# Patient Record
Sex: Female | Born: 1990 | Race: White | Hispanic: No | Marital: Married | Smoking: Never smoker
Health system: Southern US, Community
[De-identification: ages and names within clinical notes are randomized; demographics above are authoritative.]

## PROBLEM LIST (undated history)

## (undated) DIAGNOSIS — L409 Psoriasis, unspecified: Secondary | ICD-10-CM

## (undated) DIAGNOSIS — E119 Type 2 diabetes mellitus without complications: Secondary | ICD-10-CM

## (undated) DIAGNOSIS — F329 Major depressive disorder, single episode, unspecified: Secondary | ICD-10-CM

## (undated) DIAGNOSIS — F32A Depression, unspecified: Secondary | ICD-10-CM

## (undated) DIAGNOSIS — E282 Polycystic ovarian syndrome: Secondary | ICD-10-CM

## (undated) DIAGNOSIS — E7212 Methylenetetrahydrofolate reductase deficiency: Secondary | ICD-10-CM

## (undated) DIAGNOSIS — N390 Urinary tract infection, site not specified: Secondary | ICD-10-CM

## (undated) DIAGNOSIS — I809 Phlebitis and thrombophlebitis of unspecified site: Secondary | ICD-10-CM

## (undated) HISTORY — DX: Phlebitis and thrombophlebitis of unspecified site: I80.9

## (undated) HISTORY — DX: Urinary tract infection, site not specified: N39.0

## (undated) HISTORY — DX: Type 2 diabetes mellitus without complications: E11.9

## (undated) HISTORY — DX: Polycystic ovarian syndrome: E28.2

## (undated) HISTORY — DX: Major depressive disorder, single episode, unspecified: F32.9

## (undated) HISTORY — DX: Depression, unspecified: F32.A

---
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
## 2011-05-27 HISTORY — PX: CHOLECYSTECTOMY: SHX55

## 2012-05-26 DIAGNOSIS — Z1589 Genetic susceptibility to other disease: Secondary | ICD-10-CM

## 2012-05-26 HISTORY — PX: TUMOR REMOVAL: SHX12

## 2012-05-26 HISTORY — DX: Genetic susceptibility to other disease: Z15.89

## 2013-08-14 LAB — CBC AND DIFFERENTIAL
HCT: 45 (ref 36–46)
HEMOGLOBIN: 14.8 (ref 12.0–16.0)
NEUTROS ABS: 3
PLATELETS: 187 (ref 150–399)
WBC: 6.4

## 2013-08-15 LAB — VITAMIN D 25 HYDROXY (VIT D DEFICIENCY, FRACTURES): Vit D, 25-Hydroxy: 11.6

## 2015-10-25 LAB — HM PAP SMEAR

## 2016-06-12 LAB — HEPATIC FUNCTION PANEL
ALK PHOS: 53 (ref 25–125)
ALT: 35 (ref 7–35)
AST: 60 — AB (ref 13–35)
BILIRUBIN, TOTAL: 2.8

## 2016-06-12 LAB — BASIC METABOLIC PANEL
BUN: 9 (ref 4–21)
CREATININE: 0.5 (ref 0.5–1.1)
Glucose: 138
Potassium: 4.6 (ref 3.4–5.3)
Sodium: 140 (ref 137–147)

## 2016-06-12 LAB — CBC AND DIFFERENTIAL
HEMATOCRIT: 46 (ref 36–46)
Hemoglobin: 14.7 (ref 12.0–16.0)
NEUTROS ABS: 4
PLATELETS: 208 (ref 150–399)
WBC: 8.9

## 2016-06-12 LAB — LIPID PANEL
CHOLESTEROL: 123 (ref 0–200)
HDL: 35 (ref 35–70)
LDL Cholesterol: 54
TRIGLYCERIDES: 172 — AB (ref 40–160)

## 2017-04-28 ENCOUNTER — Ambulatory Visit (INDEPENDENT_AMBULATORY_CARE_PROVIDER_SITE_OTHER): Payer: BLUE CROSS/BLUE SHIELD | Admitting: Family Medicine

## 2017-04-28 ENCOUNTER — Telehealth: Payer: Self-pay

## 2017-04-28 ENCOUNTER — Telehealth: Payer: Self-pay | Admitting: Family Medicine

## 2017-04-28 ENCOUNTER — Encounter: Payer: Self-pay | Admitting: Family Medicine

## 2017-04-28 VITALS — BP 151/88 | HR 84 | Ht 64.5 in | Wt 298.4 lb

## 2017-04-28 DIAGNOSIS — E1165 Type 2 diabetes mellitus with hyperglycemia: Secondary | ICD-10-CM | POA: Diagnosis not present

## 2017-04-28 DIAGNOSIS — F419 Anxiety disorder, unspecified: Secondary | ICD-10-CM | POA: Insufficient documentation

## 2017-04-28 DIAGNOSIS — L0291 Cutaneous abscess, unspecified: Secondary | ICD-10-CM | POA: Diagnosis not present

## 2017-04-28 DIAGNOSIS — R030 Elevated blood-pressure reading, without diagnosis of hypertension: Secondary | ICD-10-CM | POA: Insufficient documentation

## 2017-04-28 DIAGNOSIS — D689 Coagulation defect, unspecified: Secondary | ICD-10-CM | POA: Diagnosis not present

## 2017-04-28 DIAGNOSIS — Z23 Encounter for immunization: Secondary | ICD-10-CM

## 2017-04-28 DIAGNOSIS — F321 Major depressive disorder, single episode, moderate: Secondary | ICD-10-CM | POA: Diagnosis not present

## 2017-04-28 DIAGNOSIS — L409 Psoriasis, unspecified: Secondary | ICD-10-CM | POA: Diagnosis not present

## 2017-04-28 LAB — POCT GLYCOSYLATED HEMOGLOBIN (HGB A1C): HEMOGLOBIN A1C: 9.4

## 2017-04-28 MED ORDER — CITALOPRAM HYDROBROMIDE 20 MG PO TABS: 40.0000 mg | ORAL_TABLET | Freq: Every day | ORAL | 3 refills | Status: DC

## 2017-04-28 MED ORDER — DULAGLUTIDE 1.5 MG/0.5ML ~~LOC~~ SOAJ: SUBCUTANEOUS | 5 refills | Status: AC

## 2017-04-28 MED ORDER — DOXYCYCLINE HYCLATE 100 MG PO TABS: 100.0000 mg | ORAL_TABLET | Freq: Two times a day (BID) | ORAL | 0 refills | Status: DC

## 2017-04-28 MED ORDER — CLOBETASOL PROPIONATE 0.05 % EX CREA: 1.0000 "application " | TOPICAL_CREAM | Freq: Two times a day (BID) | CUTANEOUS | 0 refills | Status: DC

## 2017-04-28 NOTE — Assessment & Plan Note (Signed)
A1c 9.4 today.  Continue metformin 1000 mg twice daily.  Start Trulicity.  Stop Humalog.  Follow-up in 1 month.  Consider addition of sulfonylurea or basal insulin if A1c not at goal in 3 months.  Due for preventative diabetes care-was not discussed at this meeting due to time constraints.

## 2017-04-28 NOTE — Assessment & Plan Note (Signed)
No signs of clot today.  Will avoid OCP in the future.

## 2017-04-28 NOTE — Telephone Encounter (Signed)
Do not see that Metformin was called in  OV:04/28/17

## 2017-04-28 NOTE — Assessment & Plan Note (Signed)
PHQ 9 score 12 today.  Start Celexa 20 mg daily.  Increase to 40 mg daily in 1-2 weeks if doing well without side effects.  Follow-up with me in 4 weeks.  Consider alternative SSRI if not adequately controlled with Celexa.

## 2017-04-28 NOTE — Patient Instructions (Addendum)
Start the [employer]m [employer] Haasrulicity.  Stop Humalog.  Start clobetasol cream for your psoriasis.  Start Celexa 20 mg daily for 1-2 weeks.  Increase to 40 mg daily if you do well without side effects.  I will send in a week course of doxycycline.  Please let me know if the area on her skin worsens or does not improve with antibiotics.  Come back to see me in about 4 weeks for follow-up.  Take care Dr. Jimmey RalphParker

## 2017-04-28 NOTE — Telephone Encounter (Unsigned)
Copied from CRM [id number]. >> Apr 28, 2017  2:04 PM Raquel SarnaHayes, Teresa G wrote: Pt is still waiting for Metformin 500mg  extended release to be called in for her.  It wasn't included in the Rx's from earlier today she pick up at the pharmacy.  [facility] [address]

## 2017-04-28 NOTE — Telephone Encounter (Signed)
PA for clobetasol propionate approved through 04/27/2018.  Patient's pharmacy has been notified.

## 2017-04-28 NOTE — Assessment & Plan Note (Signed)
GAD 7 score 15 today.  Start Celexa as noted above.  Follow-up in 4 weeks.

## 2017-04-28 NOTE — Assessment & Plan Note (Signed)
Initially elevated 157/94 today.  Per patient she is typically in the 120s over 80s at home.  No indication to start medications today.  We will follow-up at next office visit.  Will consider ACE inhibitor given her concurrent type 2 diabetes if continues to be elevated.

## 2017-04-28 NOTE — Progress Notes (Signed)
Subjective:  Bonnie Glenn is a 26 y.o. female who presents today with a chief complaint of T2DM and to establish care.   HPI:  [place] with her husband.  T2DM, New Problem Several year history.  Current medications include metformin 1000 mg twice daily and Humalog 10 units with meals 3 times daily.  Fasting sugars per patient usually in the 180s.  Last her A1c checked 10 months ago and it was 9.6.  Has been on several medications in the past including Lantus, Jardiance, and glipizide.  No polyuria polydipsia.  Was unable to tolerate jardiance due to recurrent UTI.   Psoriasis, new problem Several year history.  Located on her elbows and hands.  Uses clobetasol cream which helps.  Depression/Anxiety, new problem Several year history.  Patient has been on several medications in the past.  Most recently has been on citalopram which worked well for her.  She has both depression and anxiety symptoms.  See below PHQ/GAD.  No HI or SI.  She is currently seeing a counselor which is helping.   Depression screen PHQ 2/9 04/28/2017  Decreased Interest 1  Down, Depressed, Hopeless 1  PHQ - 2 Score 2  Altered sleeping 3  Tired, decreased energy 2  Change in appetite 2  Feeling bad or failure about yourself  1  Trouble concentrating 2  Moving slowly or fidgety/restless 0  Suicidal thoughts 0  PHQ-9 Score 12    GAD 7 : Generalized Anxiety Score 04/28/2017  Nervous, Anxious, on Edge 3  Control/stop worrying 3  Worry too much - different things 3  Trouble relaxing 2  Restless 1  Easily annoyed or irritable 3  Afraid - awful might happen 0  Total GAD 7 Score 15    ROS: No SI or HI.  Rash, acute issue Started a few days ago.  Patient has a history of recurrent rashes along her abdomen.  Will usually last for a few days and then resolved.  Thinks that she may have been bitten by something.  No fevers or chills.  No drainage.  ROS: Per HPI,  otherwise a 14 point review of systems was performed and was negative  PMH:  The following were reviewed and entered/updated in epic: Past Medical History:  Diagnosis Date  . Depression   . Diabetes mellitus without complication (HCC)   . PCOS (polycystic ovarian syndrome)   . Phlebitis   . Urinary tract infection    Patient Active Problem List   Diagnosis Date Noted  . Blood clotting disorder (HCC) 04/28/2017  . Type 2 diabetes mellitus with hyperglycemia (HCC) 04/28/2017  . Psoriasis 04/28/2017  . Depression, major, single episode, moderate (HCC) 04/28/2017  . Anxiety 04/28/2017  . Morbid obesity (HCC) 04/28/2017  . Elevated blood pressure reading 04/28/2017   Past Surgical History:  Procedure Laterality Date  . CHOLECYSTECTOMY  2013  . EYE SURGERY Left 1998   Blood Clot Removed Due To Injury  . TUMOR REMOVAL  2014   Left Ovary    Family History  Problem Relation Age of Onset  . COPD Mother   . Hypertension Mother   . Alcohol abuse Father   . Early death Father   . Heart attack Father   . Alcohol abuse Sister   . Drug abuse Sister   . COPD Maternal Grandmother   . Mental illness Maternal Grandmother   . Birth defects Maternal Grandfather   . Early death Paternal Grandmother  Medications- reviewed and updated Current Outpatient Medications  Medication Sig Dispense Refill  . metFORMIN (GLUCOPHAGE-XR) 500 MG 24 hr tablet Take 500 mg by mouth. Take 2 tablets 2 times daily    . citalopram (CELEXA) 20 MG tablet Take 2 tablets (40 mg total) by mouth daily. 60 tablet 3  . clobetasol cream (TEMOVATE) 0.05 % Apply 1 application topically 2 (two) times daily. 30 g 0  . doxycycline (VIBRA-TABS) 100 MG tablet Take 1 tablet (100 mg total) by mouth 2 (two) times daily. 14 tablet 0  . Dulaglutide 1.5 MG/0.5ML SOPN Inject into the skin weekly. 4 pen 5   No current facility-administered medications for this visit.    Allergies-reviewed and updated No Known  Allergies  Social History   Socioeconomic History  . Marital status: Married    Spouse name: None  . Number of children: 0  . Years of education: None  . Highest education level: None  Social Needs  . Financial resource strain: None  . Food insecurity - worry: None  . Food insecurity - inability: None  . Transportation needs - medical: None  . Transportation needs - non-medical: None  Occupational History  . Occupation: Archivist  Tobacco Use  . Smoking status: Never Smoker  . Smokeless tobacco: Never Used  Substance and Sexual Activity  . Alcohol use: No    Frequency: Never  . Drug use: No  . Sexual activity: No    Partners: Male  Other Topics Concern  . None  Social History Narrative  . None   Objective:  Physical Exam: BP (!) 151/88 (BP Location: Left Arm, Cuff Size: Normal)   Pulse 84   Ht 5' 4.5" (1.638 m)   Wt 298 lb 6.4 oz (135.4 kg)   LMP 04/09/2017 (Exact Date)   SpO2 98%   BMI 50.43 kg/m   Gen: NAD, resting comfortably CV: RRR with no murmurs appreciated Pulm: NWOB, CTAB with no crackles, wheezes, or rhonchi GI: Morbidly obese, normal bowel sounds present. Soft, Nontender, Nondistended. MSK: No edema, cyanosis, or clubbing noted Skin: 1 cm abscess on the left lower abdomen with surrounding erythema.  Raised, erythematous, scaly plaques on elbows bilaterally as well as on hands. Neuro: Grossly normal, moves all extremities Psych: Normal affect and thought content  Results for orders placed or performed in visit on 04/28/17 (from the past 72 hour(s))  POCT HgB A1C     Status: None   Collection Time: 04/28/17  9:59 AM  Result Value Ref Range   Hemoglobin A1C 9.4    Assessment/Plan:  Type 2 diabetes mellitus with hyperglycemia (HCC) A1c 9.4 today.  Continue metformin 1000 mg twice daily.  Start Trulicity.  Stop Humalog.  Follow-up in 1 month.  Consider addition of sulfonylurea or basal insulin if A1c not at goal in 3 months.  Due for preventative  diabetes care-was not discussed at this meeting due to time constraints.  Psoriasis Clobetasol cream sent in today.  Consider referral to dermatology if not adequately controlled with topical steroid.  Depression, major, single episode, moderate (HCC) PHQ 9 score 12 today.  Start Celexa 20 mg daily.  Increase to 40 mg daily in 1-2 weeks if doing well without side effects.  Follow-up with me in 4 weeks.  Consider alternative SSRI if not adequately controlled with Celexa.  Anxiety GAD 7 score 15 today.  Start Celexa as noted above.  Follow-up in 4 weeks.  Morbid obesity (HCC) BMI 50.43 today.  Discussed lifestyle modifications.  Hopefully will have some weight loss with GLP-1 agonist.  Blood clotting disorder (HCC) No signs of clot today.  Will avoid OCP in the future.  Elevated blood pressure reading Initially elevated 157/94 today.  Per patient she is typically in the 120s over 80s at home.  No indication to start medications today.  We will follow-up at next office visit.  Will consider ACE inhibitor given her concurrent type 2 diabetes if continues to be elevated.  Abscess No signs of systemic infection.  Offered I&D today, however patient deferred.  Start doxycycline for 7-day course.  Strict return precautions reviewed.  Preventative healthcare Flu shot given today.  Will obtain prior records for her most recent Pap and screening blood work.  Katina Degreealeb M. Jimmey RalphParker, MD 04/28/2017 10:10 AM

## 2017-04-28 NOTE — Telephone Encounter (Signed)
Copied from CRM [id number]. Topic: Inquiry >> Apr 28, 2017  2:04 PM Raquel SarnaHayes, Teresa G wrote: Pt is still waiting for Metformin 500mg  extended release to be called in for her.  It wasn't included in the Rx's from earlier today she pick up at the pharmacy.  [facility] [address]

## 2017-04-28 NOTE — Assessment & Plan Note (Signed)
Clobetasol cream sent in today.  Consider referral to dermatology if not adequately controlled with topical steroid.

## 2017-04-28 NOTE — Assessment & Plan Note (Signed)
BMI 50.43 today.  Discussed lifestyle modifications.  Hopefully will have some weight loss with GLP-1 agonist.

## 2017-04-29 ENCOUNTER — Other Ambulatory Visit: Payer: Self-pay

## 2017-04-29 MED ORDER — METFORMIN HCL ER 500 MG PO TB24: 1000.0000 mg | ORAL_TABLET | Freq: Two times a day (BID) | ORAL | 5 refills | Status: DC

## 2017-04-29 MED ORDER — METFORMIN HCL ER 500 MG PO TB24: 500.0000 mg | ORAL_TABLET | Freq: Every day | ORAL | 5 refills | Status: DC

## 2017-04-29 NOTE — Telephone Encounter (Signed)
Metformin has been sent to pharmacy.

## 2017-05-12 ENCOUNTER — Encounter: Payer: Self-pay | Admitting: Physical Therapy

## 2017-05-21 ENCOUNTER — Encounter: Payer: Self-pay | Admitting: Physical Therapy

## 2017-05-27 ENCOUNTER — Ambulatory Visit: Payer: BLUE CROSS/BLUE SHIELD | Admitting: Family Medicine

## 2017-06-10 ENCOUNTER — Encounter: Payer: Self-pay | Admitting: Family Medicine

## 2017-06-10 ENCOUNTER — Ambulatory Visit (INDEPENDENT_AMBULATORY_CARE_PROVIDER_SITE_OTHER): Payer: BLUE CROSS/BLUE SHIELD | Admitting: Family Medicine

## 2017-06-10 VITALS — BP 134/82 | HR 100 | Temp 98.9°F | Ht 64.5 in | Wt 302.0 lb

## 2017-06-10 DIAGNOSIS — F419 Anxiety disorder, unspecified: Secondary | ICD-10-CM

## 2017-06-10 DIAGNOSIS — F321 Major depressive disorder, single episode, moderate: Secondary | ICD-10-CM

## 2017-06-10 DIAGNOSIS — E1165 Type 2 diabetes mellitus with hyperglycemia: Secondary | ICD-10-CM | POA: Diagnosis not present

## 2017-06-10 DIAGNOSIS — L409 Psoriasis, unspecified: Secondary | ICD-10-CM | POA: Diagnosis not present

## 2017-06-10 LAB — MICROALBUMIN / CREATININE URINE RATIO
Creatinine,U: 124.4 mg/dL
Microalb Creat Ratio: 64.7 mg/g — ABNORMAL HIGH (ref 0.0–30.0)
Microalb, Ur: 80.5 mg/dL — ABNORMAL HIGH (ref 0.0–1.9)

## 2017-06-10 MED ORDER — TRIAMCINOLONE ACETONIDE 0.5 % EX OINT: 1.0000 "application " | TOPICAL_OINTMENT | Freq: Two times a day (BID) | CUTANEOUS | 0 refills | Status: DC

## 2017-06-10 MED ORDER — CITALOPRAM HYDROBROMIDE 20 MG PO TABS: 20.0000 mg | ORAL_TABLET | Freq: Every day | ORAL | 11 refills | Status: AC

## 2017-06-10 NOTE — Assessment & Plan Note (Signed)
Discussed lifestyle interventions.  Referral to nutrition placed.  Referral to bariatric surgery placed.

## 2017-06-10 NOTE — Assessment & Plan Note (Signed)
Unable to afford clobetasol.  We will try triamcinolone 0.5% cream.  May ultimately need referral to dermatology with consideration of biological agents if symptoms not controlled with topical steroid.

## 2017-06-10 NOTE — Assessment & Plan Note (Signed)
Doing well on current regimen of Trulicity and metformin.  We will continue both of these.  Patient will let us know if the headaches persist.  If this happens, we can decrease to the 0.75 mg weekly injection.  Will check urine microalbumin/creatinine ratio today.  Advised patient to have eye exam done soon.  She will need her foot exam soon.  Offered pneumonia shot however patient deferred.  Follow-up in 2 months for repeat A1c.

## 2017-06-10 NOTE — Progress Notes (Signed)
Subjective:  Bonnie Glenn is a 27 y.o. female who presents today with a chief complaint of abscess follow-up.   HPI:  Abscess, established problem, improving Patient seen about a month ago for this.  She was started on a week's course of doxycycline and did well.  Symptoms have since resolved.  Depression/Anxiety, established problem, Stable Current Medications: Celexa 20 mg daily Side Effects: None Current Symptoms/Interim History: Patient initially seen about a month ago for both of these problems.  She was started on Celexa at that time.  Initially was on Celexa 20 mg daily.  She try to increase to 40 mg daily however noticed increased fidgetiness and anxiety.  She feels like the 20 mg dose works better for her.  She has noticed a significant improvement in her symptoms.  Depression screen PHQ 2/9 06/10/2017  Decreased Interest 1  Down, Depressed, Hopeless 1  PHQ - 2 Score 2  Altered sleeping 1  Tired, decreased energy 1  Change in appetite 1  Feeling bad or failure about yourself  1  Trouble concentrating 0  Moving slowly or fidgety/restless 0  Suicidal thoughts 0  PHQ-9 Score 6  Difficult doing work/chores Somewhat difficult    GAD 7 : Generalized Anxiety Score 06/10/2017  Nervous, Anxious, on Edge 1  Control/stop worrying 1  Worry too much - different things 1  Trouble relaxing 1  Restless 0  Easily annoyed or irritable 0  Afraid - awful might happen 1  Total GAD 7 Score 5  Anxiety Difficulty Somewhat difficult    ROS: No SI or HI.  DIABETES Type II, established problem, Stable Patient is currently on metformin 1000 mg twice daily and Trulicity 1.5 mg weekly.  She started Trulicity about a month ago.  She has done well with this.  She noticed that she has an occasional headache the day after taking the Trulicity however does not notice any other side effects.  No nausea or vomiting.  No constipation or diarrhea.  No polyuria or polydipsia.  No hypoglycemic symptoms-no  palpitations, tremors, or anxiousness.  Psoriasis, established problem, stable Seen for this about a month ago.  She was prescribed clobetasol however she is not able to afford it.  She has not tried anything in the interim.  Lesions are stable since her last visit.  Morbid obesity, established problem, stable Several year history.  She is currently doing a weight watchers program which does not significantly seem to help.  She has been on medications including phentermine in the past which have not worked well for her either.  She has some interest in seeing a surgeon to discuss bariatric surgery.  She goes to the gym 2-3 times weekly and exercises for about 45 minutes each time.  ROS: Per HPI  PMH: She reports that  has never smoked. she has never used smokeless tobacco. She reports that she does not drink alcohol or use drugs.  Objective:  Physical Exam: BP 134/82 (BP Location: Left Arm, Patient Position: Sitting, Cuff Size: Large)   Pulse 100   Temp 98.9 F (37.2 C) (Oral)   Ht 5' 4.5" (1.638 m)   Wt (!) 302 lb (137 kg)   SpO2 98%   BMI 51.04 kg/m   Gen: NAD, resting comfortably CV: RRR with no murmurs appreciated Pulm: NWOB, CTAB with no crackles, wheezes, or rhonchi GI: Morbidly obese, normal bowel sounds present. Soft, Nontender, Nondistended. MSK: No edema, cyanosis, or clubbing noted Skin: Warm, dry Neuro: Grossly normal, moves all  extremities Psych: Normal affect and thought content  Assessment/Plan:  Depression, major, single episode, moderate (HCC) PHQ 9 significantly improved to 6 today.  Continue Celexa 20 mg daily.  Follow-up in 2 months.  Anxiety See depression A/P.  Continue Celexa 20 mg daily.  Type 2 diabetes mellitus with hyperglycemia (HCC) Doing well on current regimen of Trulicity and metformin.  We will continue both of these.  Patient will let us know if the headaches persist.  If this happens, we can decrease to the 0.75 mg weekly injection.  Will  check urine microalbumin/creatinine ratio today.  Advised patient to have eye exam done soon.  She will need her foot exam soon.  Offered pneumonia shot however patient deferred.  Follow-up in 2 months for repeat A1c.  Psoriasis Unable to afford clobetasol.  We will try triamcinolone 0.5% cream.  May ultimately need referral to dermatology with consideration of biological agents if symptoms not controlled with topical steroid.   Morbid obesity (HCC) Discussed lifestyle interventions.  Referral to nutrition placed.  Referral to bariatric surgery placed.  Abscess Resolved with antibiotics.  No further intervention needed.  Katina Degreealeb M. Jimmey RalphParker, MD 06/10/2017 3:00 PM

## 2017-06-10 NOTE — Assessment & Plan Note (Signed)
PHQ 9 significantly improved to 6 today.  Continue Celexa 20 mg daily.  Follow-up in 2 months.

## 2017-06-10 NOTE — Patient Instructions (Signed)
Continue Celexa 20 mg daily.  I am glad this is working well for you.  Let me know if you continue to have headaches with the Trulicity.  We can decrease the dose if this is the case.  Please use the triamcinolone for your psoriasis.  I will put in a referral to nutritionist into the bariatric surgery.  Please come back to see me in 2 months, or sooner as needed.  Take care, Dr. Jimmey RalphParker

## 2017-06-10 NOTE — Assessment & Plan Note (Signed)
See depression A/P.  Continue Celexa 20 mg daily.

## 2017-06-11 ENCOUNTER — Encounter: Payer: Self-pay | Admitting: Family Medicine

## 2017-06-11 DIAGNOSIS — R809 Proteinuria, unspecified: Secondary | ICD-10-CM

## 2017-06-11 DIAGNOSIS — E1129 Type 2 diabetes mellitus with other diabetic kidney complication: Secondary | ICD-10-CM | POA: Insufficient documentation

## 2017-06-11 NOTE — Progress Notes (Signed)
Patient had a small amount of protein in her urine - do not need to make any changes right now. We should re-check again at her follow up appointment in 2 months.  Bonnie Degreealeb M. Jimmey RalphParker, MD 06/11/2017 8:18 AM

## 2017-06-23 ENCOUNTER — Encounter: Payer: Self-pay | Admitting: Physical Therapy

## 2017-06-23 ENCOUNTER — Ambulatory Visit: Payer: BLUE CROSS/BLUE SHIELD | Admitting: Registered"

## 2017-06-26 ENCOUNTER — Encounter: Payer: Self-pay | Admitting: Family Medicine

## 2017-07-01 ENCOUNTER — Encounter: Payer: Self-pay | Admitting: Registered"

## 2017-07-01 ENCOUNTER — Encounter: Payer: BLUE CROSS/BLUE SHIELD | Attending: Family Medicine | Admitting: Registered"

## 2017-07-01 DIAGNOSIS — Z713 Dietary counseling and surveillance: Secondary | ICD-10-CM | POA: Diagnosis present

## 2017-07-01 DIAGNOSIS — E119 Type 2 diabetes mellitus without complications: Secondary | ICD-10-CM

## 2017-07-01 NOTE — Patient Instructions (Addendum)
-   Aim to eat well-balanced meals.  - Increase non-starchy vegetable intake.   - Pair carbohydrates with protein sources.   - Add in snacks as needed.

## 2017-07-01 NOTE — Progress Notes (Signed)
  Medical Nutrition Therapy:  Appt start time: 2:30 end time:  2:51.  Assessment:  Primary concerns today: Pt states she has officially decided to have bariatric surgery, upcoming initial consult with surgeon on Friday. Pt states she feels she needs to do something with health considering her uncle and dad both died at an early age. Pt states her uncle had diabetes. Pt states she was diagnosed with diabetes at age 27. Pt reports recent A1c was 9.6; states it is an improvement from previous results. Pt states she her BS: FBS (115) and 2 hours after meals (140). Pt states she meal plans for the week on Sundays; sometimes she and husband eat out a lot. Pt states she is currently in school studying information technology.  Preferred Learning Style:   No preference indicated   Learning Readiness:   Ready  Change in progress   MEDICATIONS: See list   DIETARY INTAKE:  Usual eating pattern includes 3 meals and 1-2 snacks per day.  Everyday foods include eggs, toast, fruit, sandwiches, and chips.  Avoided foods include none.    24-hr recall:  B ( AM): 2 scrambled eggs, 2 toast, lite spread  Snk ( AM): sometimes dried fruit, chex mix L ( PM): chicken wings (air-fried), sweet potato fries (air-fried) Snk ( PM): sometimes banana D ( PM): ham and cheese sandwich, baked chips Snk ( PM): none Beverages: coffee with protein shake, water, sparkling ice, unsweetened tea   Usual physical activity: sometimes gym 40 min, 3x/week  Estimated energy needs: 2000 calories 225 g carbohydrates 150 g protein 56 g fat  Progress Towards Goal(s):  In progress.   Nutritional Diagnosis:  NI-5.8.5 Inadeqate fiber intake As related to food- and nutrition-related knowledge deficit concerning desirable quantities of fiber.  As evidenced by pt report of insufficient fiber intake when compared to recommended amounts (25g/day for women) .    Intervention:  Nutrition education and counseling. Pt was educated and  counseled on MyPlate method, eating well-balanced meals, and the benefits of increasing fiber intake.  Goals: - Aim to eat well-balanced meals. - Increase non-starchy vegetable intake.  - Pair carbohydrates with protein sources.  - Add in snacks as needed.   Teaching Method Utilized:  Visual Auditory Hands on  Handouts given during visit include:  MyPlate  Barriers to learning/adherence to lifestyle change: none  Demonstrated degree of understanding via:  Teach Back   Monitoring/Evaluation:  Dietary intake, exercise, and body weight prn.

## 2017-07-10 ENCOUNTER — Other Ambulatory Visit (HOSPITAL_COMMUNITY): Payer: Self-pay | Admitting: Surgery

## 2017-07-13 ENCOUNTER — Encounter: Payer: Self-pay | Admitting: Family Medicine

## 2017-07-15 ENCOUNTER — Encounter: Payer: BLUE CROSS/BLUE SHIELD | Admitting: Registered"

## 2017-07-15 DIAGNOSIS — E119 Type 2 diabetes mellitus without complications: Secondary | ICD-10-CM

## 2017-07-15 DIAGNOSIS — Z713 Dietary counseling and surveillance: Secondary | ICD-10-CM | POA: Diagnosis not present

## 2017-07-15 NOTE — Progress Notes (Signed)
Pre-Op Assessment Visit:  Pre-Operative Sleeve/RYGB Surgery  Medical Nutrition Therapy:  Appt start time: 9:30  End time: 10:10  Patient was seen on 07/15/2017 for Pre-Operative Nutrition Assessment. Assessment [facility] on 07/15/2017.   Pt expectation of surgery: lose 75-100 lbs in 1 year; goal of 145 lbs in 2 years, improve quality of life, better managed diabetes  Pt expectation of Dietitian: guidance   Start weight at [facility]: 301.7 BMI: 51.38    Pt arrives with husband and states she tries to avoid pasta, rice and substitute with cauliflower rice, zucchini noodles. Pt states she likes 3 flavors of Premier protein. Pt's recent A1c was 9.4. Pt checks BS: FBS (115) and 2 hrs after meals (140).   Per insurance, pt needs 0 SWL visits prior to surgery.    24 hr Dietary Recall: First Meal: scrambled eggs, toast Snack: none Second Meal: leftovers or [employer]-sub sandwich Snack: none Third Meal: chicken wings (air fried), mashed cauliflower Snack: sometimes ice cream sandwich or fruit or vegetables with peanut butter/ranch or yogurt or sugar free pudding Beverages: water, unsweetened tea, vitamin water zero, 1c coffee (every other day)  Encouraged to engage in 150 minutes of moderate physical activity including cardiovascular and weight baring weekly  Handouts given during visit include:  . Pre-Op Goals . Bariatric Surgery Protein Shakes . Vitamin and Mineral Recommendations  During the appointment today the following Pre-Op Goals were reviewed with the patient: . Track your food and beverage: MyFitness Pal or Baritastic App . Make healthy food choices . Begin to limit portion sizes . Limited concentrated sugars and fried foods . Keep fat/sugar in the single digits per serving on          food labels . Practice CHEWING your food  (aim for 30 chews per bite or until applesauce  consistency) . Practice not drinking 15 minutes before, during, and 30 minutes after each meal/snack . Avoid all carbonated beverages  . Avoid/limit caffeinated beverages  . Avoid all sugar-sweetened beverages . Avoid alcohol . Consume 3 meals per day; eat every 3-5 hours . Make a list of non-food related activities . Aim for 64-100 ounces of FLUID daily  . Aim for at least 60-80 grams of PROTEIN daily . Look for a liquid protein source that contain ?15 g protein and ?5 g carbohydrate  (ex: shakes, drinks, shots) . Physical activity is an important part of a healthy lifestyle so keep it moving!  Follow diet recommendations listed below Energy and Macronutrient Recommendations: Calories: 2000 Carbohydrate: 225 Protein: 150 Fat: 56  Demonstrated degree of understanding via:  Teach Back   Teaching Method Utilized:  Visual Auditory Hands on  Barriers to learning/adherence to lifestyle change: none identified  Patient to call the [facility] to enroll in Pre-Op and Post-Op Nutrition Education when surgery date is scheduled.

## 2017-07-17 ENCOUNTER — Ambulatory Visit (HOSPITAL_COMMUNITY)
Admission: RE | Admit: 2017-07-17 | Discharge: 2017-07-17 | Disposition: A | Discharge status: Home / Self Care | Payer: BLUE CROSS/BLUE SHIELD | Source: Ambulatory Visit | Attending: Surgery | Admitting: Surgery

## 2017-07-17 DIAGNOSIS — K449 Diaphragmatic hernia without obstruction or gangrene: Secondary | ICD-10-CM | POA: Diagnosis not present

## 2017-07-27 ENCOUNTER — Encounter: Payer: BLUE CROSS/BLUE SHIELD | Attending: Surgery | Admitting: Skilled Nursing Facility1

## 2017-07-27 DIAGNOSIS — Z713 Dietary counseling and surveillance: Secondary | ICD-10-CM | POA: Diagnosis present

## 2017-07-27 DIAGNOSIS — E119 Type 2 diabetes mellitus without complications: Secondary | ICD-10-CM

## 2017-07-29 ENCOUNTER — Encounter: Payer: Self-pay | Admitting: Family Medicine

## 2017-07-29 ENCOUNTER — Ambulatory Visit (INDEPENDENT_AMBULATORY_CARE_PROVIDER_SITE_OTHER): Payer: BLUE CROSS/BLUE SHIELD | Admitting: Family Medicine

## 2017-07-29 ENCOUNTER — Encounter: Payer: Self-pay | Admitting: Skilled Nursing Facility1

## 2017-07-29 VITALS — BP 128/70 | HR 68 | Temp 97.8°F | Ht 64.5 in | Wt 304.0 lb

## 2017-07-29 DIAGNOSIS — R809 Proteinuria, unspecified: Secondary | ICD-10-CM | POA: Diagnosis not present

## 2017-07-29 DIAGNOSIS — L409 Psoriasis, unspecified: Secondary | ICD-10-CM

## 2017-07-29 DIAGNOSIS — E1129 Type 2 diabetes mellitus with other diabetic kidney complication: Secondary | ICD-10-CM

## 2017-07-29 DIAGNOSIS — E1165 Type 2 diabetes mellitus with hyperglycemia: Secondary | ICD-10-CM | POA: Diagnosis not present

## 2017-07-29 LAB — POCT GLYCOSYLATED HEMOGLOBIN (HGB A1C): HEMOGLOBIN A1C: 7.5

## 2017-07-29 MED ORDER — GLUCOSE BLOOD VI STRP: ORAL_STRIP | 12 refills | Status: DC

## 2017-07-29 MED ORDER — CLOBETASOL PROPIONATE 0.05 % EX OINT: 1.0000 "application " | TOPICAL_OINTMENT | Freq: Two times a day (BID) | CUTANEOUS | 0 refills | Status: AC

## 2017-07-29 MED ORDER — GLUCOSE BLOOD VI STRP: ORAL_STRIP | 12 refills | Status: AC

## 2017-07-29 NOTE — Progress Notes (Signed)
Pre-Operative Nutrition Class:  Appt start time: 0100   End time:  1830.  Patient was seen on 07/27/2017 for Pre-Operative Bariatric Surgery Education at [facility].   Surgery date:  Surgery type: RYGB Start weight at [facility]: 301.7 Weight today: 304.9  Samples given per MNT protocol. Patient educated on appropriate usage: Procare Multivitamin Lot # [id number] Exp: 11/2018  Bariatric Advantage Calcium  Lot # [id number] Exp: aug-12-2017  Renee Pain Protein  Shake Lot # [id number] Exp: oct-13-19  The following the learning objectives were met by the patient during this course:  Identify Pre-Op Dietary Goals and will begin 2 weeks pre-operatively  Identify appropriate sources of fluids and proteins   State protein recommendations and appropriate sources pre and post-operatively  Identify Post-Operative Dietary Goals and will follow for 2 weeks post-operatively  Identify appropriate multivitamin and calcium sources  Describe the need for physical activity post-operatively and will follow MD recommendations  State when to call healthcare provider regarding medication questions or post-operative complications  Handouts given during class include:  Pre-Op Bariatric Surgery Diet Handout  Protein Shake Handout  Post-Op Bariatric Surgery Nutrition Handout  [facility] Program Information Flyer  Support Group Information Flyer  [facility] Bariatric Supplements Price List  [facility]: [facility] per MD.

## 2017-07-29 NOTE — Assessment & Plan Note (Signed)
A1c improved to 7.5 from 9.4.  Continue Trulicity and metformin at current doses.  Anticipate improvement after her bariatric surgery.  We will continue her medications for now.  Discussed that both these medications will help her keep her weight off.  She will follow-up with me in 3 months.  Consider decreasing dose or stopping medications depending on her glycemic response to bariatric surgery.

## 2017-07-29 NOTE — Assessment & Plan Note (Signed)
Recheck today.  Continues to have significant microalbuminuria, would consider starting low-dose ACE inhibitor or ARB.

## 2017-07-29 NOTE — Progress Notes (Signed)
    Subjective:  Bonnie DeitersJenna Glenn is a [age] y.o. female who presents today with a chief complaint of type 2 diabetes.   HPI:  Type 2 diabetes, established problem, improving Patient seen about 2 months ago for this.  Current medications include Trulicity 1.5 mg weekly and metformin 1000 mg twice daily.  She is doing well both these medications without any side effects.  No hypoglycemic symptoms.  No polyuria or polydipsia.  She will be undergoing a gastric bypass surgery in a couple of weeks.  She is concerned that she may not be able to tolerate the extended release form of metformin as it is a large pill.  Psoriasis, established problem, improving Patient also seen about 2 months ago for this.  That time we started triamcinolone 0.5%.  This is helped some with her lesions however she still has a significant amount of redness and irritation to the area.  She has tried clobetasol in the past which is worked well.  She would like to try this today.  No areas of drainage.  No fever or chills.  Microalbuminuria, new problem Found a couple of months ago on her screening microalbuminuria test.  Likely secondary to diabetes.  ROS: Per HPI  PMH: She reports that  has never smoked. she has never used smokeless tobacco. She reports that she does not drink alcohol or use drugs.  Objective:  Physical Exam: BP 128/70 (BP Location: Left Arm, Patient Position: Sitting, Cuff Size: Large)   Pulse 68   Temp 97.8 F (36.6 C) (Oral)   Ht 5' 4.5" (1.638 m)   Wt (!) 304 lb (137.9 kg)   SpO2 95%   BMI 51.38 kg/m   Gen: NAD, resting comfortably CV: RRR with no murmurs appreciated Pulm: NWOB, CTAB with no crackles, wheezes, or rhonchi MSK: -Feet: No deformities.  Monofilament testing intact bilaterally.  Distal pulses intact. Skin: Several plaques consistent with psoriasis on lower and upper extremities.  A1c 7.5  Assessment/Plan:  Type 2 diabetes mellitus with hyperglycemia (HCC) A1c improved to 7.5 from  9.4.  Continue Trulicity and metformin at current doses.  Anticipate improvement after her bariatric surgery.  We will continue her medications for now.  Discussed that both these medications will help her keep her weight off.  She will follow-up with me in 3 months.  Consider decreasing dose or stopping medications depending on her glycemic response to bariatric surgery.  Psoriasis Stop triamcinolone.  Start clobetasol.  If continues to have severe symptoms, will need referral to dermatology for possible Biologics.  Microalbuminuria due to type 2 diabetes mellitus (HCC) Recheck today.  Continues to have significant microalbuminuria, would consider starting low-dose ACE inhibitor or ARB.  Katina Degreealeb M. Jimmey RalphParker, MD 07/29/2017 9:05 AM

## 2017-07-29 NOTE — Addendum Note (Signed)
Addended by: London SheerFRIZZELL, Truitt Cruey T on: 07/29/2017 01:49 PM   Modules accepted: Orders

## 2017-07-29 NOTE — Patient Instructions (Signed)
It was nice seeing you today. I hope your surgery goes well!  We will check a urine sample today.  Please try the clobetasol.  No other changes today.  Come back to see me in 3 months or sooner as needed.   Take care, Dr Jimmey RalphParker

## 2017-07-29 NOTE — Assessment & Plan Note (Signed)
Stop triamcinolone.  Start clobetasol.  If continues to have severe symptoms, will need referral to dermatology for possible Biologics.

## 2017-07-30 ENCOUNTER — Ambulatory Visit: Payer: Self-pay | Admitting: Surgery

## 2017-08-03 ENCOUNTER — Ambulatory Visit: Payer: BLUE CROSS/BLUE SHIELD

## 2017-08-06 NOTE — Patient Instructions (Addendum)
Bonnie DeitersJenna Mo  08/06/2017   Your procedure is scheduled on: Tuesday 08/11/2017  Report to [facility] Main  Entrance    ARRIVE AT 530 AM. Have a seat in the Main Lobby. Please note there is a phone at the [employer]. Please call [phone] on that phone. Someone from Short Stay will come and get you from the Main Lobby and take you to Short Stay.    Call this number if you have problems the morning of surgery [phone]            Follow Bowel prep instructions  from Dr. Ezzard StandingNewman the day before surgery on Monday 08/10/2017 with a clear liquid diet.  NO SOLID FOOD AFTER MIDNIGHT THE NIGHT PRIOR TO SURGERY. NOTHING BY MOUTH EXCEPT CLEAR LIQUIDS UNTIL 3 HOURS PRIOR TO SCHEULED SURGERY. PLEASE FINISH ENSURE DRINK PER SURGEON ORDER 3 HOURS PRIOR TO SCHEDULED SURGERY TIME WHICH NEEDS TO BE COMPLETED AT 0430 am.    CLEAR LIQUID DIET   Foods Allowed                                                                     Foods Excluded  Coffee and tea, regular and decaf                             liquids that you cannot  Plain Jell-O in any flavor                                             see through such as: Fruit ices (not with fruit pulp)                                     milk, soups, orange juice  Iced Popsicles                                    All solid food Carbonated beverages, regular and diet                                    Cranberry, grape and apple juices Sports drinks like Gatorade Lightly seasoned clear broth or consume(fat free) Sugar, honey syrup  Sample Menu Breakfast                                Lunch                                     Supper Cranberry juice                    Beef broth  Chicken broth Jell-O                                     Grape juice                           Apple juice Coffee or tea                        Jell-O                                      Popsicle                                                 Coffee or tea                        Coffee or tea  _____________________________________________________________________              How to Manage Your Diabetes Before and After Surgery  Why is it important to control my blood sugar before and after surgery? . Improving blood sugar levels before and after surgery helps healing and can limit problems. . A way of improving blood sugar control is eating a healthy diet by: o  Eating less sugar and carbohydrates o  Increasing activity/exercise o  Talking with your doctor about reaching your blood sugar goals . High blood sugars (greater than 180 mg/dL) can raise your risk of infections and slow your recovery, so you will need to focus on controlling your diabetes during the weeks before surgery. . Make sure that the doctor who takes care of your diabetes knows about your planned surgery including the date and location.  How do I manage my blood sugar before surgery? . Check your blood sugar at least 4 times a day, starting 2 days before surgery, to make sure that the level is not too high or low. o Check your blood sugar the morning of your surgery when you wake up and every 2 hours until you get to the Short Stay unit. . If your blood sugar is less than 70 mg/dL, you will need to treat for low blood sugar: o Do not take insulin. o Treat a low blood sugar (less than 70 mg/dL) with  cup of clear juice (cranberry or apple), 4 glucose tablets, OR glucose gel. o Recheck blood sugar in 15 minutes after treatment (to make sure it is greater than 70 mg/dL). If your blood sugar is not greater than 70 mg/dL on recheck, call [phone] for further instructions. . Report your blood sugar to the short stay nurse when you get to Short Stay.  . If you are admitted to the hospital after surgery: o Your blood sugar will be checked by the staff and you will probably be given insulin after surgery (instead of oral diabetes  medicines) to make sure you have good blood sugar levels. o The goal for blood sugar control after surgery is 80-180 mg/dL.   WHAT DO I DO ABOUT MY DIABETES MEDICATION?        Take Metformin the day before surgery  on Monday 08/11/2017  as usual.         . Do not take oral diabetes medicines (pills) the morning of surgery.      . The day of surgery, do not take other diabetes injectables, including Byetta (exenatide), Bydureon (exenatide ER), Victoza (liraglutide), or Trulicity (dulaglutide).    Remember: Do not eat food or drink liquids :After Midnight.     Take these medicines the morning of surgery with A SIP OF WATER: Citalopram (Celexa)   DO NOT TAKE ANY DIABETIC MEDICATIONS DAY OF YOUR SURGERY                               You may not have any metal on your body including hair pins and              piercings  Do not wear jewelry, make-up, lotions, powders or perfumes, deodorant             Do not wear nail polish.  Do not shave  48 hours prior to surgery.              Men may shave face and neck.   Do not bring valuables to the hospital. [facility] IS NOT             RESPONSIBLE   FOR VALUABLES.  Contacts, dentures or bridgework may not be worn into surgery.  Leave suitcase in the car. After surgery it may be brought to your room.                Please read over the following fact sheets you were given: _____________________________________________________________________             [facility] - Preparing for Surgery Before surgery, you can play an important role.  Because skin is not sterile, your skin needs to be as free of germs as possible.  You can reduce the number of germs on your skin by washing with CHG (chlorahexidine gluconate) soap before surgery.  CHG is an antiseptic cleaner which kills germs and bonds with the skin to continue killing germs even after washing. Please DO NOT use if you have an allergy to CHG or antibacterial soaps.  If your skin becomes  reddened/irritated stop using the CHG and inform your nurse when you arrive at Short Stay. Do not shave (including legs and underarms) for at least 48 hours prior to the first CHG shower.  You may shave your face/neck. Please follow these instructions carefully:  1.  Shower with CHG Soap the night before surgery and the  morning of Surgery.  2.  If you choose to wash your hair, wash your hair first as usual with your  normal  shampoo.  3.  After you shampoo, rinse your hair and body thoroughly to remove the  shampoo.                           4.  Use CHG as you would any other liquid soap.  You can apply chg directly  to the skin and wash                       Gently with a scrungie or clean washcloth.  5.  Apply the CHG Soap to your body ONLY FROM THE NECK DOWN.   Do not use on face/ open  Wound or open sores. Avoid contact with eyes, ears mouth and genitals (private parts).                       Wash face,  Genitals (private parts) with your normal soap.             6.  Wash thoroughly, paying special attention to the area where your surgery  will be performed.  7.  Thoroughly rinse your body with warm water from the neck down.  8.  DO NOT shower/wash with your normal soap after using and rinsing off  the CHG Soap.                9.  Pat yourself dry with a clean towel.            10.  Wear clean pajamas.            11.  Place clean sheets on your bed the night of your first shower and do not  sleep with pets. Day of Surgery : Do not apply any lotions/deodorants the morning of surgery.  Please wear clean clothes to the hospital/surgery center.  FAILURE TO FOLLOW THESE INSTRUCTIONS MAY RESULT IN THE CANCELLATION OF YOUR SURGERY PATIENT SIGNATURE_________________________________  NURSE SIGNATURE__________________________________  ________________________________________________________________________   Adam Phenix  An incentive spirometer is a tool that  can help keep your lungs clear and active. This tool measures how well you are filling your lungs with each breath. Taking long deep breaths may help reverse or decrease the chance of developing breathing (pulmonary) problems (especially infection) following:  A long period of time when you are unable to move or be active. BEFORE THE PROCEDURE   If the spirometer includes an indicator to show your best effort, your nurse or respiratory therapist will set it to a desired goal.  If possible, sit up straight or lean slightly forward. Try not to slouch.  Hold the incentive spirometer in an upright position. INSTRUCTIONS FOR USE  1. Sit on the edge of your bed if possible, or sit up as far as you can in bed or on a chair. 2. Hold the incentive spirometer in an upright position. 3. Breathe out normally. 4. Place the mouthpiece in your mouth and seal your lips tightly around it. 5. Breathe in slowly and as deeply as possible, raising the piston or the ball toward the top of the column. 6. Hold your breath for 3-5 seconds or for as long as possible. Allow the piston or ball to fall to the bottom of the column. 7. Remove the mouthpiece from your mouth and breathe out normally. 8. Rest for a few seconds and repeat Steps 1 through 7 at least 10 times every 1-2 hours when you are awake. Take your time and take a few normal breaths between deep breaths. 9. The spirometer may include an indicator to show your best effort. Use the indicator as a goal to work toward during each repetition. 10. After each set of 10 deep breaths, practice coughing to be sure your lungs are clear. If you have an incision (the cut made at the time of surgery), support your incision when coughing by placing a pillow or rolled up towels firmly against it. Once you are able to get out of bed, walk around indoors and cough well. You may stop using the incentive spirometer when instructed by your caregiver.  RISKS AND  COMPLICATIONS  Take your time so you do not  get dizzy or light-headed.  If you are in pain, you may need to take or ask for pain medication before doing incentive spirometry. It is harder to take a deep breath if you are having pain. AFTER USE  Rest and breathe slowly and easily.  It can be helpful to keep track of a log of your progress. Your caregiver can provide you with a simple table to help with this. If you are using the spirometer at home, follow these instructions: Comfort IF:   You are having difficultly using the spirometer.  You have trouble using the spirometer as often as instructed.  Your pain medication is not giving enough relief while using the spirometer.  You develop fever of 100.5 F (38.1 C) or higher. SEEK IMMEDIATE MEDICAL CARE IF:   You cough up bloody sputum that had not been present before.  You develop fever of 102 F (38.9 C) or greater.  You develop worsening pain at or near the incision site. MAKE SURE YOU:   Understand these instructions.  Will watch your condition.  Will get help right away if you are not doing well or get worse. Document Released: 09/22/2006 Document Revised: 08/04/2011 Document Reviewed: 11/23/2006 ExitCare Patient Information 2014 ExitCare, [place].   ________________________________________________________________________  WHAT IS A BLOOD TRANSFUSION? Blood Transfusion Information  A transfusion is the replacement of blood or some of its parts. Blood is made up of multiple cells which provide different functions.  Red blood cells carry oxygen and are used for blood loss replacement.  White blood cells fight against infection.  Platelets control bleeding.  Plasma helps clot blood.  Other blood products are available for specialized needs, such as hemophilia or other clotting disorders. BEFORE THE TRANSFUSION  Who gives blood for transfusions?   Healthy volunteers who are fully evaluated to make sure  their blood is safe. This is blood bank blood. Transfusion therapy is the safest it has ever been in the practice of medicine. Before blood is taken from a donor, a complete history is taken to make sure that person has no history of diseases nor engages in risky social behavior (examples are intravenous drug use or sexual activity with multiple partners). The donor's travel history is screened to minimize risk of transmitting infections, such as malaria. The donated blood is tested for signs of infectious diseases, such as HIV and hepatitis. The blood is then tested to be sure it is compatible with you in order to minimize the chance of a transfusion reaction. If you or a relative donates blood, this is often done in anticipation of surgery and is not appropriate for emergency situations. It takes many days to process the donated blood. RISKS AND COMPLICATIONS Although transfusion therapy is very safe and saves many lives, the main dangers of transfusion include:   Getting an infectious disease.  Developing a transfusion reaction. This is an allergic reaction to something in the blood you were given. Every precaution is taken to prevent this. The decision to have a blood transfusion has been considered carefully by your caregiver before blood is given. Blood is not given unless the benefits outweigh the risks. AFTER THE TRANSFUSION  Right after receiving a blood transfusion, you will usually feel much better and more energetic. This is especially true if your red blood cells have gotten low (anemic). The transfusion raises the level of the red blood cells which carry oxygen, and this usually causes an energy increase.  The nurse administering the transfusion will  monitor you carefully for complications. HOME CARE INSTRUCTIONS  No special instructions are needed after a transfusion. You may find your energy is better. Speak with your caregiver about any limitations on activity for underlying diseases  you may have. SEEK MEDICAL CARE IF:   Your condition is not improving after your transfusion.  You develop redness or irritation at the intravenous (IV) site. SEEK IMMEDIATE MEDICAL CARE IF:  Any of the following symptoms occur over the next 12 hours:  Shaking chills.  You have a temperature by mouth above 102 F (38.9 C), not controlled by medicine.  Chest, back, or muscle pain.  People around you feel you are not acting correctly or are confused.  Shortness of breath or difficulty breathing.  Dizziness and fainting.  You get a rash or develop hives.  You have a decrease in urine output.  Your urine turns a dark color or changes to pink, red, or brown. Any of the following symptoms occur over the next 10 days:  You have a temperature by mouth above 102 F (38.9 C), not controlled by medicine.  Shortness of breath.  Weakness after normal activity.  The white part of the eye turns yellow (jaundice).  You have a decrease in the amount of urine or are urinating less often.  Your urine turns a dark color or changes to pink, red, or brown. Document Released: 05/09/2000 Document Revised: 08/04/2011 Document Reviewed: 12/27/2007 [facility] Patient Information 2014 [place], [place].  _______________________________________________________________________

## 2017-08-07 ENCOUNTER — Other Ambulatory Visit: Payer: Self-pay

## 2017-08-07 ENCOUNTER — Encounter (HOSPITAL_COMMUNITY)
Admission: RE | Admit: 2017-08-07 | Discharge: 2017-08-07 | Disposition: A | Discharge status: Home / Self Care | Payer: BLUE CROSS/BLUE SHIELD | Source: Ambulatory Visit | Attending: Surgery | Admitting: Surgery

## 2017-08-07 ENCOUNTER — Encounter (HOSPITAL_COMMUNITY): Payer: Self-pay

## 2017-08-07 DIAGNOSIS — E119 Type 2 diabetes mellitus without complications: Secondary | ICD-10-CM | POA: Diagnosis not present

## 2017-08-07 DIAGNOSIS — Z01818 Encounter for other preprocedural examination: Secondary | ICD-10-CM | POA: Diagnosis present

## 2017-08-07 DIAGNOSIS — I498 Other specified cardiac arrhythmias: Secondary | ICD-10-CM | POA: Insufficient documentation

## 2017-08-07 DIAGNOSIS — I10 Essential (primary) hypertension: Secondary | ICD-10-CM | POA: Diagnosis not present

## 2017-08-07 HISTORY — DX: Psoriasis, unspecified: L40.9

## 2017-08-07 HISTORY — DX: Methylenetetrahydrofolate reductase deficiency: E72.12

## 2017-08-07 LAB — COMPREHENSIVE METABOLIC PANEL
ALK PHOS: 43 U/L (ref 38–126)
ALT: 38 U/L (ref 14–54)
ANION GAP: 11 (ref 5–15)
AST: 57 U/L — ABNORMAL HIGH (ref 15–41)
Albumin: 4.5 g/dL (ref 3.5–5.0)
BILIRUBIN TOTAL: 2.9 mg/dL — AB (ref 0.3–1.2)
BUN: 11 mg/dL (ref 6–20)
CALCIUM: 9.3 mg/dL (ref 8.9–10.3)
CO2: 24 mmol/L (ref 22–32)
Chloride: 105 mmol/L (ref 101–111)
Creatinine, Ser: 0.53 mg/dL (ref 0.44–1.00)
Glucose, Bld: 108 mg/dL — ABNORMAL HIGH (ref 65–99)
Potassium: 4.4 mmol/L (ref 3.5–5.1)
Sodium: 140 mmol/L (ref 135–145)
TOTAL PROTEIN: 7.6 g/dL (ref 6.5–8.1)

## 2017-08-07 LAB — CBC WITH DIFFERENTIAL/PLATELET
Basophils Absolute: 0.1 10*3/uL (ref 0.0–0.1)
Basophils Relative: 1 %
EOS ABS: 0.2 10*3/uL (ref 0.0–0.7)
Eosinophils Relative: 2 %
HCT: 43.9 % (ref 36.0–46.0)
HEMOGLOBIN: 15.1 g/dL — AB (ref 12.0–15.0)
LYMPHS ABS: 2.6 10*3/uL (ref 0.7–4.0)
Lymphocytes Relative: 31 %
MCH: 30 pg (ref 26.0–34.0)
MCHC: 34.4 g/dL (ref 30.0–36.0)
MCV: 87.1 fL (ref 78.0–100.0)
MONOS PCT: 7 %
Monocytes Absolute: 0.6 10*3/uL (ref 0.1–1.0)
Neutro Abs: 4.9 10*3/uL (ref 1.7–7.7)
Neutrophils Relative %: 59 %
Platelets: 236 10*3/uL (ref 150–400)
RBC: 5.04 MIL/uL (ref 3.87–5.11)
RDW: 13.1 % (ref 11.5–15.5)
WBC: 8.3 10*3/uL (ref 4.0–10.5)

## 2017-08-07 LAB — GLUCOSE, CAPILLARY: Glucose-Capillary: 108 mg/dL — ABNORMAL HIGH (ref 65–99)

## 2017-08-07 LAB — HCG, SERUM, QUALITATIVE: Preg, Serum: NEGATIVE

## 2017-08-08 LAB — ABO/RH: ABO/RH(D): A POS

## 2017-08-09 NOTE — H&P (Signed)
Bonnie Glenn  Location: [facility] Surgery Patient #: [id number] DOB: 1991-05-22 Married / Language: English / Race: White Female  History of Present Illness   The patient is a 27 year old female who presents with a complaint of weight loss surgery.  The PCP is Dr. Jacquiline Doe.  The patient was referred by Dr. Jacquiline Doe.  She comes wit her husband - Sam.  She has decided on the gastric bypass. I went over specific issues and plans with that surgery. She has signed her consent and that is at the office. She has talked to the dietitian and gotten her quesitons answered.  UGI - 07/17/2017 - 1. Normal esophageal motility. Very small sliding-type hiatal hernia but no GE reflux. 2. Normal appearance of the stomach. 3. 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 The colon appears normally positioned. (I gave the patient a copy of the report) Psych - Saw Dr. Esperanza Heir Labs look good.  History of morbid obesity: The patient is interested in weight loss surgery. She listened to [employer]. She is undecided between the sleeve and gastric bypass at this time. She has been overweight since her teenage years. She has tried multiple diets including: Weight Watchers, Atkins diet, T2 diet, and using [employer]. She has tried phentermine a couple times and Qsymia. She lost about 50 pounds with the phentermine, but had side effects and had to stop.  Per the 1991 NIH Consensus Statement, the patient is a candidate for bariatric surgery. The patient has listended to an information session and reviewed the types of bariatric surgery.  The patient decided on the Roux en Y Gastric Bypass. I discussed with the patient the indications and risks of bariatric surgery. The potential risks of surgery include, but are not limited to, bleeding, infection, leak from the bowel, DVT and PE, open surgery, long term nutrition consequences, and death. The  patient understands the importance of compliance and long term follow-up with our group after surgery.  Plan: 1) She has completed our pre op eval 2) Talked about a realistic weight post op would be around 200 (BMI 35), 3) for RYGB - aiming for 08/06/2017, 4) She is going to set up an appt with her PCP about 1 - 2 weeks post op.  Past Medical History: 1. DM x 5 years On Trulicity and metformin .. doing better on this 2. History of depression 3. Psoriasis 4. lap chole - 2013 5. Left ovary tumor, benign - open resection - 2014  Social History: She is accompanied by her husband Sam. She is not working. 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  Allergies (April Staton, CMA; 07/28/2017 2:31 PM) No Known Drug Allergies [07/10/2017]:  Medication History (April Staton, CMA; 07/28/2017 2:31 PM) Citalopram Hydrobromide (20MG  Tablet, Oral) Active. MetFORMIN HCl ER (500MG  Tablet ER 24HR, Oral) Active. Triamcinolone Acetonide (0.5% Ointment, External) Active. Trulicity (1.5MG /0.5ML Soln Pen-inj, Subcutaneous) Active. Belviq XR (20MG  Tablet ER 24HR, Oral) Active. Medications Reconciled  Vitals (April Staton CMA; 07/28/2017 2:31 PM) 07/28/2017 2:31 PM Weight: 306.13 lb Height: 65in Body Surface Area: 2.37 m Body Mass Index: 50.94 kg/m  Temp.: 97.93F(Oral)  Pulse: 92 (Regular)  P.OX: 98% (Room air) BP: 144/90 (Sitting, Left Arm, Standard)  Physical Exam  General: Obese WF alert and generally healthy appearing. Skin: Inspection and palpation of the skin unremarkable.  Eyes: Conjunctivae white, pupils equal. Face, ears, nose, mouth, and throat: Face - normal. Normal ears and nose. Lips and  teeth normal.  Neck: Supple. No mass. Trachea midline. No thyroid mass. She has a very stout neck Lymph Nodes: No supraclavicular or cervical adenopathy.  Lungs: Normal respiratory effort. Clear to auscultation and symmetric breath  sounds. Cardiovascular: Regular rate and rythm. Normal auscultation of the heart. No murmur or rub.  Abdomen: Soft. No mass. Liver and spleen not palpable. No tenderness. No hernia. Normal bowel sounds.  Pfanannstiel incision She is much more apple than pear. She has a female body habitus.  Rectal: Not done.  Musculoskeletal/extremities: Normal gait. Good strength and ROM in upper and lower extremities.   Neurologic: Grossly intact to motor and sensory function.   Psychiatric: Has normal mood and affect. Judgement and insight appear normal.   Assessment & Plan  1.  OBESITY, MORBID, BMI 50 OR HIGHER (E66.01)  Plan:   1) Bowel prep instructions given, meds for pain, nausea, and anti-acid   2) Operative consent is at the office    3) She is on for RYGB on 08/11/2017  2.  DIABETES MELLITUS TYPE 2 IN OBESE (E11.69)  DM x 5 years  On Trulicity and metformin .. doing better on this 3. History of depression 4 Psoriasis 5. lap chole - 2013 6. Left ovary tumor, benign - open resection - 2014   Ovidio Kinavid Drakkar Medeiros, MD, [facility] Pager: [phone] Office phone:  [phone]

## 2017-08-11 ENCOUNTER — Inpatient Hospital Stay (HOSPITAL_COMMUNITY)
Admission: RE | Admit: 2017-08-11 | Discharge: 2017-08-13 | DRG: 621 | Disposition: A | Discharge status: Home / Self Care | Payer: BLUE CROSS/BLUE SHIELD | Source: Ambulatory Visit | Attending: Surgery | Admitting: Surgery

## 2017-08-11 ENCOUNTER — Encounter (HOSPITAL_COMMUNITY): Payer: Self-pay | Admitting: Certified Registered Nurse Anesthetist

## 2017-08-11 ENCOUNTER — Inpatient Hospital Stay (HOSPITAL_COMMUNITY): Payer: BLUE CROSS/BLUE SHIELD | Admitting: Anesthesiology

## 2017-08-11 ENCOUNTER — Encounter (HOSPITAL_COMMUNITY)
Admission: RE | Disposition: A | Discharge status: Home / Self Care | Payer: Self-pay | Source: Ambulatory Visit | Attending: Surgery

## 2017-08-11 ENCOUNTER — Other Ambulatory Visit: Payer: Self-pay

## 2017-08-11 DIAGNOSIS — L409 Psoriasis, unspecified: Secondary | ICD-10-CM | POA: Diagnosis present

## 2017-08-11 DIAGNOSIS — E119 Type 2 diabetes mellitus without complications: Secondary | ICD-10-CM | POA: Diagnosis present

## 2017-08-11 DIAGNOSIS — K660 Peritoneal adhesions (postprocedural) (postinfection): Secondary | ICD-10-CM | POA: Diagnosis present

## 2017-08-11 DIAGNOSIS — F329 Major depressive disorder, single episode, unspecified: Secondary | ICD-10-CM | POA: Diagnosis present

## 2017-08-11 DIAGNOSIS — N7011 Chronic salpingitis: Secondary | ICD-10-CM | POA: Diagnosis present

## 2017-08-11 DIAGNOSIS — K7460 Unspecified cirrhosis of liver: Secondary | ICD-10-CM | POA: Diagnosis present

## 2017-08-11 DIAGNOSIS — Z6841 Body Mass Index (BMI) 40.0 and over, adult: Secondary | ICD-10-CM

## 2017-08-11 DIAGNOSIS — Z79899 Other long term (current) drug therapy: Secondary | ICD-10-CM

## 2017-08-11 DIAGNOSIS — Z7984 Long term (current) use of oral hypoglycemic drugs: Secondary | ICD-10-CM | POA: Diagnosis not present

## 2017-08-11 DIAGNOSIS — K449 Diaphragmatic hernia without obstruction or gangrene: Secondary | ICD-10-CM | POA: Diagnosis present

## 2017-08-11 HISTORY — PX: GASTRIC ROUX-EN-Y: SHX5262

## 2017-08-11 LAB — GLUCOSE, CAPILLARY
GLUCOSE-CAPILLARY: 258 mg/dL — AB (ref 65–99)
GLUCOSE-CAPILLARY: 169 mg/dL — AB (ref 65–99)
GLUCOSE-CAPILLARY: 214 mg/dL — AB (ref 65–99)
Glucose-Capillary: 268 mg/dL — ABNORMAL HIGH (ref 65–99)
Glucose-Capillary: 269 mg/dL — ABNORMAL HIGH (ref 65–99)
Glucose-Capillary: 210 mg/dL — ABNORMAL HIGH (ref 65–99)

## 2017-08-11 LAB — PREGNANCY, URINE: Preg Test, Ur: NEGATIVE

## 2017-08-11 LAB — TYPE AND SCREEN
ABO/RH(D): A POS
ANTIBODY SCREEN: NEGATIVE

## 2017-08-11 LAB — HEMOGLOBIN AND HEMATOCRIT, BLOOD
HEMATOCRIT: 41 % (ref 36.0–46.0)
Hemoglobin: 13.9 g/dL (ref 12.0–15.0)

## 2017-08-11 SURGERY — LAPAROSCOPIC ROUX-EN-Y GASTRIC BYPASS WITH UPPER ENDOSCOPY
Anesthesia: General | Site: Abdomen

## 2017-08-11 MED ORDER — APREPITANT 40 MG PO CAPS
40.0000 mg | ORAL_CAPSULE | ORAL | Status: AC
  Administered 2017-08-11: 40 mg via ORAL
  Filled 2017-08-11: qty 1

## 2017-08-11 MED ORDER — LIDOCAINE HCL 2 % IJ SOLN
INTRAMUSCULAR | Status: AC
  Filled 2017-08-11: qty 20

## 2017-08-11 MED ORDER — ROCURONIUM BROMIDE 10 MG/ML (PF) SYRINGE
PREFILLED_SYRINGE | INTRAVENOUS | Status: AC
  Filled 2017-08-11: qty 5

## 2017-08-11 MED ORDER — INSULIN ASPART 100 UNIT/ML ~~LOC~~ SOLN
SUBCUTANEOUS | Status: AC
  Administered 2017-08-11: 11 [IU] via SUBCUTANEOUS
  Filled 2017-08-11: qty 1

## 2017-08-11 MED ORDER — ONDANSETRON HCL 4 MG/2ML IJ SOLN
4.0000 mg | Freq: Once | INTRAMUSCULAR | Status: AC | PRN
  Administered 2017-08-11: 4 mg via INTRAVENOUS

## 2017-08-11 MED ORDER — ONDANSETRON HCL 4 MG/2ML IJ SOLN
INTRAMUSCULAR | Status: AC
  Administered 2017-08-11: 4 mg via INTRAVENOUS
  Filled 2017-08-11: qty 2

## 2017-08-11 MED ORDER — FENTANYL CITRATE (PF) 250 MCG/5ML IJ SOLN
INTRAMUSCULAR | Status: AC
  Filled 2017-08-11: qty 5

## 2017-08-11 MED ORDER — CEFOTETAN DISODIUM-DEXTROSE 2-2.08 GM-%(50ML) IV SOLR
2.0000 g | INTRAVENOUS | Status: AC
  Administered 2017-08-11: 2 g via INTRAVENOUS
  Filled 2017-08-11: qty 50

## 2017-08-11 MED ORDER — ONDANSETRON HCL 4 MG/2ML IJ SOLN
INTRAMUSCULAR | Status: DC | PRN
  Administered 2017-08-11: 4 mg via INTRAVENOUS

## 2017-08-11 MED ORDER — MEPERIDINE HCL 50 MG/ML IJ SOLN: 6.2500 mg | INTRAMUSCULAR | Status: DC | PRN

## 2017-08-11 MED ORDER — ACETAMINOPHEN 500 MG PO TABS
1000.0000 mg | ORAL_TABLET | ORAL | Status: AC
  Administered 2017-08-11: 1000 mg via ORAL
  Filled 2017-08-11: qty 2

## 2017-08-11 MED ORDER — SUGAMMADEX SODIUM 500 MG/5ML IV SOLN
INTRAVENOUS | Status: AC
Start: 2017-08-11 — End: ?
  Filled 2017-08-11: qty 5

## 2017-08-11 MED ORDER — HYDROMORPHONE HCL 1 MG/ML IJ SOLN
0.2500 mg | INTRAMUSCULAR | Status: DC | PRN
  Administered 2017-08-11 (×3): 0.5 mg via INTRAVENOUS

## 2017-08-11 MED ORDER — OXYCODONE HCL 5 MG/5ML PO SOLN
5.0000 mg | ORAL | Status: DC | PRN
  Administered 2017-08-12 – 2017-08-13 (×3): 5 mg via ORAL
  Filled 2017-08-11 (×3): qty 5

## 2017-08-11 MED ORDER — PREMIER PROTEIN SHAKE
2.0000 [oz_av] | ORAL | Status: DC
  Administered 2017-08-12 – 2017-08-13 (×12): 2 [oz_av] via ORAL

## 2017-08-11 MED ORDER — SCOPOLAMINE 1 MG/3DAYS TD PT72
1.0000 | MEDICATED_PATCH | TRANSDERMAL | Status: DC
  Administered 2017-08-11: 1.5 mg via TRANSDERMAL
  Filled 2017-08-11: qty 1

## 2017-08-11 MED ORDER — ONDANSETRON HCL 4 MG/2ML IJ SOLN
4.0000 mg | Freq: Once | INTRAMUSCULAR | Status: AC
  Administered 2017-08-11: 4 mg via INTRAVENOUS

## 2017-08-11 MED ORDER — MIDAZOLAM HCL 5 MG/5ML IJ SOLN
INTRAMUSCULAR | Status: DC | PRN
  Administered 2017-08-11: 2 mg via INTRAVENOUS

## 2017-08-11 MED ORDER — FENTANYL CITRATE (PF) 100 MCG/2ML IJ SOLN
INTRAMUSCULAR | Status: AC
  Filled 2017-08-11: qty 2

## 2017-08-11 MED ORDER — KETAMINE HCL 10 MG/ML IJ SOLN
INTRAMUSCULAR | Status: DC | PRN
  Administered 2017-08-11: 30 mg via INTRAVENOUS

## 2017-08-11 MED ORDER — INSULIN ASPART 100 UNIT/ML ~~LOC~~ SOLN
0.0000 [IU] | SUBCUTANEOUS | Status: DC
  Administered 2017-08-11: 7 [IU] via SUBCUTANEOUS
  Administered 2017-08-11: 11 [IU] via SUBCUTANEOUS
  Administered 2017-08-11: 7 [IU] via SUBCUTANEOUS
  Administered 2017-08-12: 3 [IU] via SUBCUTANEOUS
  Administered 2017-08-12 (×2): 4 [IU] via SUBCUTANEOUS
  Administered 2017-08-12: 3 [IU] via SUBCUTANEOUS
  Administered 2017-08-12: 4 [IU] via SUBCUTANEOUS
  Administered 2017-08-12 – 2017-08-13 (×3): 3 [IU] via SUBCUTANEOUS
  Administered 2017-08-13: 4 [IU] via SUBCUTANEOUS

## 2017-08-11 MED ORDER — MIDAZOLAM HCL 2 MG/2ML IJ SOLN
INTRAMUSCULAR | Status: AC
  Filled 2017-08-11: qty 2

## 2017-08-11 MED ORDER — ACETAMINOPHEN 325 MG PO TABS
650.0000 mg | ORAL_TABLET | Freq: Four times a day (QID) | ORAL | Status: DC
  Filled 2017-08-11: qty 2

## 2017-08-11 MED ORDER — PROPOFOL 10 MG/ML IV BOLUS
INTRAVENOUS | Status: AC
  Filled 2017-08-11: qty 40

## 2017-08-11 MED ORDER — ACETAMINOPHEN 160 MG/5ML PO SOLN
650.0000 mg | Freq: Four times a day (QID) | ORAL | Status: DC
  Administered 2017-08-11 – 2017-08-13 (×7): 650 mg via ORAL
  Filled 2017-08-11 (×7): qty 20.3

## 2017-08-11 MED ORDER — LACTATED RINGERS IR SOLN
Status: DC | PRN
  Administered 2017-08-11: 1000 mL

## 2017-08-11 MED ORDER — MORPHINE SULFATE (PF) 2 MG/ML IV SOLN
1.0000 mg | INTRAVENOUS | Status: DC | PRN
  Administered 2017-08-11: 2 mg via INTRAVENOUS
  Administered 2017-08-11: 1 mg via INTRAVENOUS
  Filled 2017-08-11 (×2): qty 1

## 2017-08-11 MED ORDER — CHLORHEXIDINE GLUCONATE 4 % EX LIQD: 60.0000 mL | Freq: Once | CUTANEOUS | Status: DC

## 2017-08-11 MED ORDER — BUPIVACAINE HCL (PF) 0.25 % IJ SOLN
INTRAMUSCULAR | Status: AC
  Filled 2017-08-11: qty 30

## 2017-08-11 MED ORDER — TISSEEL VH 10 ML EX KIT
PACK | CUTANEOUS | Status: DC | PRN
  Administered 2017-08-11: 1

## 2017-08-11 MED ORDER — PROPOFOL 10 MG/ML IV BOLUS
INTRAVENOUS | Status: DC | PRN
  Administered 2017-08-11: 250 mg via INTRAVENOUS

## 2017-08-11 MED ORDER — HEPARIN SODIUM (PORCINE) 5000 UNIT/ML IJ SOLN
5000.0000 [IU] | INTRAMUSCULAR | Status: AC
  Administered 2017-08-11: 5000 [IU] via SUBCUTANEOUS
  Filled 2017-08-11: qty 1

## 2017-08-11 MED ORDER — LIDOCAINE 2% (20 MG/ML) 5 ML SYRINGE
INTRAMUSCULAR | Status: DC | PRN
  Administered 2017-08-11: 100 mg via INTRAVENOUS

## 2017-08-11 MED ORDER — LACTATED RINGERS IV SOLN
INTRAVENOUS | Status: DC | PRN
  Administered 2017-08-11 (×3): via INTRAVENOUS

## 2017-08-11 MED ORDER — LIDOCAINE 2% (20 MG/ML) 5 ML SYRINGE
INTRAMUSCULAR | Status: DC | PRN
  Administered 2017-08-11: 1.5 mg/kg/h via INTRAVENOUS

## 2017-08-11 MED ORDER — KETAMINE HCL 10 MG/ML IJ SOLN
INTRAMUSCULAR | Status: AC
  Filled 2017-08-11: qty 1

## 2017-08-11 MED ORDER — DEXAMETHASONE SODIUM PHOSPHATE 10 MG/ML IJ SOLN
INTRAMUSCULAR | Status: AC
  Filled 2017-08-11: qty 1

## 2017-08-11 MED ORDER — TISSEEL VH 10 ML EX KIT
PACK | CUTANEOUS | Status: AC
  Filled 2017-08-11: qty 2

## 2017-08-11 MED ORDER — HYDROMORPHONE HCL 1 MG/ML IJ SOLN
INTRAMUSCULAR | Status: AC
  Administered 2017-08-11: 0.5 mg via INTRAVENOUS
  Filled 2017-08-11: qty 2

## 2017-08-11 MED ORDER — ONDANSETRON HCL 4 MG/2ML IJ SOLN
INTRAMUSCULAR | Status: AC
Start: 2017-08-11 — End: ?
  Filled 2017-08-11: qty 2

## 2017-08-11 MED ORDER — ROCURONIUM BROMIDE 50 MG/5ML IV SOSY
PREFILLED_SYRINGE | INTRAVENOUS | Status: DC | PRN
  Administered 2017-08-11 (×2): 20 mg via INTRAVENOUS
  Administered 2017-08-11: 10 mg via INTRAVENOUS
  Administered 2017-08-11 (×2): 20 mg via INTRAVENOUS
  Administered 2017-08-11: 60 mg via INTRAVENOUS
  Administered 2017-08-11: 20 mg via INTRAVENOUS

## 2017-08-11 MED ORDER — FENTANYL CITRATE (PF) 250 MCG/5ML IJ SOLN
INTRAMUSCULAR | Status: DC | PRN
  Administered 2017-08-11: 50 ug via INTRAVENOUS
  Administered 2017-08-11: 100 ug via INTRAVENOUS
  Administered 2017-08-11 (×4): 50 ug via INTRAVENOUS

## 2017-08-11 MED ORDER — BUPIVACAINE HCL (PF) 0.25 % IJ SOLN
INTRAMUSCULAR | Status: DC | PRN
  Administered 2017-08-11: 30 mL

## 2017-08-11 MED ORDER — PANTOPRAZOLE SODIUM 40 MG IV SOLR
40.0000 mg | Freq: Every day | INTRAVENOUS | Status: DC
  Administered 2017-08-11 – 2017-08-12 (×2): 40 mg via INTRAVENOUS
  Filled 2017-08-11 (×2): qty 40

## 2017-08-11 MED ORDER — BUPIVACAINE LIPOSOME 1.3 % IJ SUSP
20.0000 mL | Freq: Once | INTRAMUSCULAR | Status: AC
  Administered 2017-08-11: 20 mL
  Filled 2017-08-11: qty 20

## 2017-08-11 MED ORDER — LIDOCAINE 2% (20 MG/ML) 5 ML SYRINGE
INTRAMUSCULAR | Status: AC
  Filled 2017-08-11: qty 5

## 2017-08-11 MED ORDER — POTASSIUM CHLORIDE IN NACL 20-0.45 MEQ/L-% IV SOLN
INTRAVENOUS | Status: DC
  Administered 2017-08-11: 1000 mL via INTRAVENOUS
  Administered 2017-08-11: 15:00:00 via INTRAVENOUS
  Administered 2017-08-12: 1000 mL via INTRAVENOUS
  Administered 2017-08-13: 02:00:00 via INTRAVENOUS
  Filled 2017-08-11 (×4): qty 1000

## 2017-08-11 MED ORDER — 0.9 % SODIUM CHLORIDE (POUR BTL) OPTIME
TOPICAL | Status: DC | PRN
  Administered 2017-08-11: 1000 mL

## 2017-08-11 MED ORDER — HEPARIN SODIUM (PORCINE) 5000 UNIT/ML IJ SOLN
5000.0000 [IU] | Freq: Three times a day (TID) | INTRAMUSCULAR | Status: DC
  Administered 2017-08-11 – 2017-08-13 (×5): 5000 [IU] via SUBCUTANEOUS
  Filled 2017-08-11 (×4): qty 1

## 2017-08-11 MED ORDER — SUCCINYLCHOLINE CHLORIDE 200 MG/10ML IV SOSY
PREFILLED_SYRINGE | INTRAVENOUS | Status: AC
  Filled 2017-08-11: qty 10

## 2017-08-11 MED ORDER — SUGAMMADEX SODIUM 500 MG/5ML IV SOLN
INTRAVENOUS | Status: DC | PRN
  Administered 2017-08-11: 300 mg via INTRAVENOUS

## 2017-08-11 MED ORDER — ONDANSETRON HCL 4 MG/2ML IJ SOLN: 4.0000 mg | INTRAMUSCULAR | Status: DC | PRN

## 2017-08-11 SURGICAL SUPPLY — 66 items
APPLIER CLIP ROT 10 11.4 M/L (STAPLE)
APPLIER CLIP ROT 13.4 12 LRG (CLIP)
BLADE SURG 15 STRL LF DISP TIS (BLADE) ×1 IMPLANT
BLADE SURG 15 STRL SS (BLADE) ×1
CABLE HIGH FREQUENCY MONO STRZ (ELECTRODE) ×2 IMPLANT
CHLORAPREP W/TINT 26ML (MISCELLANEOUS) ×4 IMPLANT
CLIP APPLIE ROT 10 11.4 M/L (STAPLE) IMPLANT
CLIP APPLIE ROT 13.4 12 LRG (CLIP) IMPLANT
CLIP SUT LAPRA TY ABSORB (SUTURE) ×6 IMPLANT
CUTTER LINEAR ENDO ART 45 ETS (STAPLE) ×2 IMPLANT
DECANTER SPIKE VIAL GLASS SM (MISCELLANEOUS) ×2 IMPLANT
DERMABOND ADVANCED (GAUZE/BANDAGES/DRESSINGS) ×1
DERMABOND ADVANCED .7 DNX12 (GAUZE/BANDAGES/DRESSINGS) ×1 IMPLANT
DEVICE SUT QUICK LOAD TK 5 (STAPLE) IMPLANT
DEVICE SUT TI-KNOT TK 5X26 (MISCELLANEOUS) IMPLANT
DEVICE SUTURE ENDOST 10MM (ENDOMECHANICALS) ×2 IMPLANT
DISSECTOR BLUNT TIP ENDO 5MM (MISCELLANEOUS) IMPLANT
DRAIN PENROSE 18X1/4 LTX STRL (WOUND CARE) ×2 IMPLANT
GAUZE SPONGE 4X4 16PLY XRAY LF (GAUZE/BANDAGES/DRESSINGS) ×2 IMPLANT
GLOVE SURG SIGNA 7.5 PF LTX (GLOVE) ×2 IMPLANT
GOWN STRL REUS W/TWL XL LVL3 (GOWN DISPOSABLE) ×8 IMPLANT
HOVERMATT SINGLE USE (MISCELLANEOUS) ×2 IMPLANT
KIT BASIN OR (CUSTOM PROCEDURE TRAY) ×2 IMPLANT
KIT GASTRIC LAVAGE 34FR ADT (SET/KITS/TRAYS/PACK) ×2 IMPLANT
LUBRICANT JELLY K Y 4OZ (MISCELLANEOUS) ×2 IMPLANT
MARKER SKIN DUAL TIP RULER LAB (MISCELLANEOUS) ×2 IMPLANT
NEEDLE SPNL 22GX3.5 QUINCKE BK (NEEDLE) ×2 IMPLANT
PACK CARDIOVASCULAR III (CUSTOM PROCEDURE TRAY) ×2 IMPLANT
RELOAD 45 VASCULAR/THIN (ENDOMECHANICALS) ×6 IMPLANT
RELOAD ENDO STITCH 2.0 (ENDOMECHANICALS) ×10
RELOAD STAPLE TA45 3.5 REG BLU (ENDOMECHANICALS) ×2 IMPLANT
RELOAD STAPLER BLUE 60MM (STAPLE) ×4 IMPLANT
RELOAD STAPLER GOLD 60MM (STAPLE) ×1 IMPLANT
RELOAD STAPLER WHITE 60MM (STAPLE) IMPLANT
SCISSORS LAP 5X45 EPIX DISP (ENDOMECHANICALS) ×2 IMPLANT
SEALANT SURGICAL APPL DUAL CAN (MISCELLANEOUS) ×2 IMPLANT
SET IRRIG TUBING LAPAROSCOPIC (IRRIGATION / IRRIGATOR) ×2 IMPLANT
SHEARS HARMONIC ACE PLUS 45CM (MISCELLANEOUS) ×2 IMPLANT
SLEEVE ADV FIXATION 12X100MM (TROCAR) ×4 IMPLANT
SLEEVE ADV FIXATION 5X100MM (TROCAR) ×2 IMPLANT
SLEEVE XCEL OPT CAN 5 100 (ENDOMECHANICALS) IMPLANT
SOLUTION ANTI FOG 6CC (MISCELLANEOUS) ×2 IMPLANT
STAPLER ECHELON LONG 60 440 (INSTRUMENTS) ×2 IMPLANT
STAPLER RELOAD BLUE 60MM (STAPLE) ×8
STAPLER RELOAD GOLD 60MM (STAPLE) ×2
STAPLER RELOAD WHITE 60MM (STAPLE)
SUT MNCRL AB 4-0 PS2 18 (SUTURE) ×6 IMPLANT
SUT RELOAD ENDO STITCH 2 48X1 (ENDOMECHANICALS) ×6
SUT RELOAD ENDO STITCH 2.0 (ENDOMECHANICALS) ×4
SUT SURGIDAC NAB ES-9 0 48 120 (SUTURE) IMPLANT
SUT VIC AB 2-0 SH 27 (SUTURE) ×1
SUT VIC AB 2-0 SH 27X BRD (SUTURE) ×1 IMPLANT
SUTURE RELOAD END STTCH 2 48X1 (ENDOMECHANICALS) ×6 IMPLANT
SUTURE RELOAD ENDO STITCH 2.0 (ENDOMECHANICALS) ×4 IMPLANT
SYR 10ML ECCENTRIC (SYRINGE) ×2 IMPLANT
SYR 20CC LL (SYRINGE) ×4 IMPLANT
SYR 50ML LL SCALE MARK (SYRINGE) ×2 IMPLANT
TOWEL OR 17X26 10 PK STRL BLUE (TOWEL DISPOSABLE) ×2 IMPLANT
TROCAR ADV FIXATION 11X100MM (TROCAR) ×2 IMPLANT
TROCAR ADV FIXATION 12X100MM (TROCAR) ×2 IMPLANT
TROCAR ADV FIXATION 5X100MM (TROCAR) IMPLANT
TROCAR BLADELESS OPT 5 100 (ENDOMECHANICALS) IMPLANT
TROCAR XCEL 12X100 BLDLESS (ENDOMECHANICALS) IMPLANT
TUBING CONNECTING 10 (TUBING) ×4 IMPLANT
TUBING ENDO SMARTCAP (MISCELLANEOUS) ×2 IMPLANT
TUBING INSUF HEATED (TUBING) ×2 IMPLANT

## 2017-08-11 NOTE — Progress Notes (Signed)
Discussed post op day goals with patient including ambulation, IS, diet progression, pain, and nausea control.  Questions answered. 

## 2017-08-11 NOTE — Op Note (Signed)
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 Daphine DeutscherMartin  M.D., FACS   Anesthesia: Gen.   Indications for procedure: This patient was undergoing a gastric bypass for morbid obesity.    Description of procedure: The endoscopy was placed in the mouth and into the oropharynx and under endoscopic vision it was advanced to the esophagogastric junction.  The pouch was insufflated and and measured and was approximately 6 cm in length.  It had good mucosa and the gastrojejunostomy appeared patent and looked good.  No bleeding or bubbles were noted..   No bleeding or leaks were detected.  The scope was withdrawn without difficulty.     Matt B. Daphine DeutscherMartin, MD, FACS General, Bariatric, & Minimally Invasive Surgery [facility], [place]

## 2017-08-11 NOTE — Op Note (Signed)
PATIENT:   Bonnie Glenn DOB:   08/29/1990 MRN:   [id number]  DATE OF PROCEDURE: 08/11/2017                   FACILITY:  [facility]  OPERATIVE REPORT  PREOPERATIVE DIAGNOSIS:  Morbid obesity.  POSTOPERATIVE DIAGNOSIS:  Morbid obesity (weight 306, BMI of 50.9), moderate nodular changes in the liver (cirrhosis), dilated left fallopian tube [Photos in the chart]  PROCEDURE:  Laparoscopic Roux-en-Y gastric bypass, antecolic, antegastric (intraoperative upper endoscopy by Dr. Sheron Nightingale)  SURGEON:  Sandria Bales. Ezzard Standing, MD  FIRST ASSISTANT:  Sheron Nightingale, MD  ANESTHESIA:  General endotracheal.  Anesthesiologist: Arta Bruce, MD CRNA: Elisabeth Cara, CRNA; Orest Dikes, CRNA  General  ESTIMATED BLOOD LOSS:  Minimal.  LOCAL ANESTHESIA:  30 cc of 1/4% Marcaine + 20 cc of Exparel  COMPLICATIONS:  None.  INDICATION FOR SURGERY:  Bonnie Glenn is a 27 y.o. white female who sees Ardith Dark, MD as her primary care doctor.  She has completed our preoperative bariatric program and now comes for a laparoscopic Roux-en-Y gastric bypass.  The indications, potential complications of surgery were explained to the patient.  Potential complications of the surgery include, but are not limited to, bleeding, infection, DVT, open surgery, and long-term nutritional consequences.  OPERATIVE NOTE:  The patient taken to room #1 at [facility] where [name] underwent a general endotracheal anesthetic, supervised by Anesthesiologist: Arta Bruce, MD CRNA: Elisabeth Cara, CRNA; Orest Dikes, CRNA.  The patient was given 2 g of cefoxitin at the beginning of the procedure.  A time-out was held and surgical checklist run.  The abdomen was prepped with ChloraPrep and sterilely draped.  I accessed the abdominal cavity through the left upper quadrant using a 12 mm Optiview trocar.  I placed 6 additional trocars: 5 mm subxiphoid, 12 mm right subcostal, 12 mm right paramedian, 12 mm left paramedian, 5 mm lateral subcostal,  and a 5 mm below to the right of the umbilicus.  I placed a block along both abdominal side walls using a mixture of Exparel and Marcaine.  The abdomen was insufflated and abdominal exploration carried out.  Right and left lobes of liver had nodular changes consistent with moderate cirrhosis.  The stomach that I could see was unremarkable.  The patient had a large greater omentum which draped over the bowel.  Some of the omentum was stuck to the lower midline.  I spent about 15 minutes taking the adhesions to the lower anterior abdominal wall down. In the LLQ/pelvis was a dilated structure attached to the left ovary.  This appeared to be a dilated left fallopian tube.  The structure appeared benign.  Photos were taken to place in the chart.  I was able to push the omentum and transverse colon up and identified the ligament of Treitz to start the operation.  I measured 40 cm of the jejunum, starting at the ligament of Tritz, and divided the jejunum with a white load of 45 mm Ethicon Endo-GIA stapler.  I divided a short length into the mesentery.  I measured 100 cm of jejunum for the future gastric limb.  I put a Penrose drain on the future gastric limb of the jejunum.  I then did a side-to-side jejunojejunostomy.  I used a 45 mm white load of the Ethicon Endo-GIA stapler.  I closed the enterotomy with 2 running 2-0 Vicryl sutures.  I tested the JJ anastomosis with an alligator forceps  and then covered this with Tisseel.  I closed the mesenteric defect with a running 2-0 silk suture with a Laparo-tye on each end.  I then divided the omentum with a Harmonic Scalpel.  I positioned the patient in reverse Trendelenburg and placed the liver retractor, which was introduced into the peritoneal cavity through a subxiphoid 5 mm trocar puncture, under the left lobe of the liver.  I then identified the gastroesophageal junction.  I went to the left at the angle of His and made a window at the left side of the  esophago-gastric junction for a target as my dissection.  I then went on the lesser curve of the stomach, measured 5 cm from the gastroesophageal junction down the lesser curve and dissected into the lesser sac from the lesser curvature side of the stomach.  I did the first firing of a 60 mm gold load Ethicon Eschelon stapler and then did 4 firings of the 60 mm blue load Ethicon Eschelon stapler.  This created a gastric pouch approximately 5 cm in length and 3 cm in width.  There was no bleeding from either the pouch or the stomach remnant site.  I placed Tisseel on the pouch side along the new greater curvature.   I over sewed the gastric remnant with a locking 2-0 Vicryl suture with a Laparo-tye on each end..  I then brought the jejunum ante-colic, ante-gastric up to the new stomach pouch and placed a posterior running 2-0 Vicryl suture.  I then made an enterotomy into the stomach using the Ewald as a back stop and an enterotomy into the jejunum.  I did a stapled side-to-side gastrojejunal anastomosis using these two enterotomies with a 45 mm blue load of the Ethicon Endo GIA stapler.  I tried to create a 2.5 cm gastrojejunal anastomosis.  I closed the enterotomy with a 2 running 2-0 Vicryl sutures.  I passed the Ewald tube through the gastrojejunal anastomosis and then did an anterior Connell suture running of 2-0 Vicryl suture for the anterior layer of the gastrojejunostomy.  The Ewald tube was then removed without difficulty.  I then closed the [place] defect with a figure-of-eight 2-0 silk suture between the mesentery of the transverse colon and the mesentery of the distal jejunum.  Dr. Daphine DeutscherMartin then scrubbed out and did an intraoperative upper endoscopy.  He identified the esophagogastric junction about 38 cm, the gastrojejunal anastomosis about 44 cm.  I clamped off the small bowel.  He insufflated air and I flooded the abdomen with saline. There was no bubbling or evidence of air leak.  He then  withdrew the scope and he will dictate that portion of the operation.    I then re-inspected the anastomoses, sucked out the saline, placed Tisseel over the stomach pouch and gastrojejunal anastomosis.   The liver retractor was removed.  The trocars were removed.  There was no bleeding at any trocar site.  The skin at each trocar site was closed with a 4-0 Monocryl suture.  I infiltrated a total about 50 cc of a mixture of 0.25% Marcaine and Exparel.    After the skin incisions were closed with sutures they were painted with DermaBond.  The sponge and needle count were correct at the end of the case.  The patient tolerated the procedure well, was transported to the recovery room in good condition.  I have a surgeon as a first assist to retract, expose, and assist on this difficult operation.    Ovidio Kinavid Zilphia Kozinski, MD,  [facility] Surgery Pager: [phone] Office phone:  [phone]

## 2017-08-11 NOTE — Anesthesia Procedure Notes (Signed)
Procedure Name: Intubation Date/Time: 08/11/2017 7:37 AM Performed by: Maxwell Caul, CRNA Pre-anesthesia Checklist: Patient identified, Emergency Drugs available, Suction available and Patient being monitored Patient Re-evaluated:Patient Re-evaluated prior to induction Oxygen Delivery Method: Circle system utilized Preoxygenation: Pre-oxygenation with 100% oxygen Induction Type: IV induction Ventilation: Mask ventilation without difficulty and Oral airway inserted - appropriate to patient size Laryngoscope Size: Mac and 4 Grade View: Grade I Tube type: Oral Tube size: 7.5 mm Number of attempts: 1 Airway Equipment and Method: Stylet and Oral airway Placement Confirmation: ETT inserted through vocal cords under direct vision,  positive ETCO2 and breath sounds checked- equal and bilateral Secured at: 21 cm Tube secured with: Tape Dental Injury: Teeth and Oropharynx as per pre-operative assessment

## 2017-08-11 NOTE — Anesthesia Preprocedure Evaluation (Signed)
Anesthesia Evaluation  Patient identified by MRN, date of birth, ID band Patient awake    Reviewed: Allergy & Precautions, NPO status , Patient's Chart, lab work & pertinent test results  Airway Mallampati: II  TM Distance: >3 FB Neck ROM: Full    Dental   Pulmonary    Pulmonary exam normal        Cardiovascular Normal cardiovascular exam     Neuro/Psych Anxiety Depression    GI/Hepatic   Endo/Other  diabetes, Type 2, Oral Hypoglycemic Agents  Renal/GU      Musculoskeletal   Abdominal   Peds  Hematology   Anesthesia Other Findings   Reproductive/Obstetrics                             Anesthesia Physical Anesthesia Plan  ASA: III  Anesthesia Plan: General   Post-op Pain Management:    Induction: Intravenous  PONV Risk Score and Plan: 3 and Dexamethasone, Ondansetron and Midazolam  Airway Management Planned: Oral ETT  Additional Equipment:   Intra-op Plan:   Post-operative Plan: Extubation in OR  Informed Consent: I have reviewed the patients History and Physical, chart, labs and discussed the procedure including the risks, benefits and alternatives for the proposed anesthesia with the patient or authorized representative who has indicated his/her understanding and acceptance.     Plan Discussed with: CRNA and Surgeon  Anesthesia Plan Comments:         Anesthesia Quick Evaluation

## 2017-08-11 NOTE — Anesthesia Postprocedure Evaluation (Signed)
Anesthesia Post Note  Patient: Bonnie Glenn  Procedure(s) Performed: LAPAROSCOPIC ROUX-EN-Y GASTRIC BYPASS WITH UPPER ENDOSCOPY (N/A Abdomen)     Patient location during evaluation: PACU Anesthesia Type: General Level of consciousness: awake and alert Pain management: pain level controlled Vital Signs Assessment: post-procedure vital signs reviewed and stable Respiratory status: spontaneous breathing, nonlabored ventilation, respiratory function stable and patient connected to nasal cannula oxygen Cardiovascular status: blood pressure returned to baseline and stable Postop Assessment: no apparent nausea or vomiting Anesthetic complications: no    Last Vitals:  Vitals:   08/11/17 1300 08/11/17 1315  BP: (!) 152/90 (!) 157/89  Pulse: 96 96  Resp: 20 (!) 22  Temp: 36.8 C 36.9 C  SpO2: 95% 97%    Last Pain:  Vitals:   08/11/17 1322  TempSrc:   PainSc: 4                  [name] DAVID

## 2017-08-11 NOTE — Interval H&P Note (Signed)
History and Physical Interval Note:  08/11/2017 7:14 AM  Bonnie Glenn  has presented today for surgery, with the diagnosis of MORBID OBESITY  The various methods of treatment have been discussed with the patient and family. Her mother and husband are at the bedside.  After consideration of risks, benefits and other options for treatment, the patient has consented to  Procedure(s): LAPAROSCOPIC ROUX-EN-Y GASTRIC BYPASS WITH UPPER ENDOSCOPY (N/A) as a surgical intervention .  The patient's history has been reviewed, patient examined, no change in status, stable for surgery.  I have reviewed the patient's chart and labs.  Questions were answered to the patient's satisfaction.     Kandis Cockingavid H Martika Egler

## 2017-08-11 NOTE — Transfer of Care (Signed)
Immediate Anesthesia Transfer of Care Note  Patient: Bonnie Glenn  Procedure(s) Performed: LAPAROSCOPIC ROUX-EN-Y GASTRIC BYPASS WITH UPPER ENDOSCOPY (N/A Abdomen)  Patient Location: PACU  Anesthesia Type:General  Level of Consciousness: awake, alert , oriented and patient cooperative  Airway & Oxygen Therapy: Patient Spontanous Breathing and Patient connected to face mask oxygen  Post-op Assessment: Report given to RN, Post -op Vital signs reviewed and stable and Patient moving all extremities  Post vital signs: Reviewed and stable  Last Vitals:  Vitals:   08/11/17 0554 08/11/17 0555  BP:  (!) 158/93  Pulse: 93   Resp: 20   Temp: 36.9 C   SpO2: 96%     Last Pain:  Vitals:   08/11/17 0554  TempSrc: Oral         Complications: No apparent anesthesia complications

## 2017-08-11 NOTE — Progress Notes (Signed)
Patient unable to start fluids as ordered at 1542, has not been out of bed yet, medicated for pain at this time

## 2017-08-12 ENCOUNTER — Encounter (HOSPITAL_COMMUNITY): Payer: Self-pay | Admitting: Surgery

## 2017-08-12 LAB — CBC WITH DIFFERENTIAL/PLATELET
BASOS PCT: 0 %
Basophils Absolute: 0 10*3/uL (ref 0.0–0.1)
Eosinophils Absolute: 0 10*3/uL (ref 0.0–0.7)
Eosinophils Relative: 1 %
HEMATOCRIT: 39.7 % (ref 36.0–46.0)
HEMOGLOBIN: 13.1 g/dL (ref 12.0–15.0)
Lymphocytes Relative: 23 %
Lymphs Abs: 2 10*3/uL (ref 0.7–4.0)
MCH: 29.5 pg (ref 26.0–34.0)
MCHC: 33 g/dL (ref 30.0–36.0)
MCV: 89.4 fL (ref 78.0–100.0)
Monocytes Absolute: 0.9 10*3/uL (ref 0.1–1.0)
Monocytes Relative: 10 %
NEUTROS ABS: 5.8 10*3/uL (ref 1.7–7.7)
NEUTROS PCT: 66 %
Platelets: 173 10*3/uL (ref 150–400)
RBC: 4.44 MIL/uL (ref 3.87–5.11)
RDW: 13.3 % (ref 11.5–15.5)
WBC: 8.8 10*3/uL (ref 4.0–10.5)

## 2017-08-12 LAB — GLUCOSE, CAPILLARY
GLUCOSE-CAPILLARY: 125 mg/dL — AB (ref 65–99)
GLUCOSE-CAPILLARY: 148 mg/dL — AB (ref 65–99)
Glucose-Capillary: 137 mg/dL — ABNORMAL HIGH (ref 65–99)
Glucose-Capillary: 154 mg/dL — ABNORMAL HIGH (ref 65–99)
Glucose-Capillary: 167 mg/dL — ABNORMAL HIGH (ref 65–99)
Glucose-Capillary: 184 mg/dL — ABNORMAL HIGH (ref 65–99)

## 2017-08-12 NOTE — Progress Notes (Addendum)
Inpatient Diabetes Program Recommendations  AACE/ADA: New Consensus Statement on Inpatient Glycemic Control (2015)  Target Ranges:  Prepandial:   less than 140 mg/dL      Peak postprandial:   less than 180 mg/dL (1-2 hours)      Critically ill patients:  140 - 180 mg/dL   Results for Bonnie Glenn, Bonnie Glenn (MRN [id number]) as of 08/12/2017 08:46  Ref. Range 08/11/2017 11:48 08/11/2017 13:00 08/11/2017 16:14 08/11/2017 20:04  Glucose-Capillary Latest Ref Range: 65 - 99 mg/dL 409268 (H) 811269 (H)  11 units Novolog  210 (H)  7 units Novolog 214 (H)  7 units Novolog   Results for Bonnie Glenn, Bonnie Glenn (MRN [id number]) as of 08/12/2017 08:46  Ref. Range 08/12/2017 00:06 08/12/2017 03:49 08/12/2017 07:40  Glucose-Capillary Latest Ref Range: 65 - 99 mg/dL 213137 (H)  3 units Novolog 148 (H)  3 units Novolog 184 (H)  4 units Novolog    Admit Gastric Bypass Surgery.   History: DM2  Home DM Meds: Trulicity 1.5 mg Qweek + Metformin 1000 mg BID  Current Orders: Novolog Resistant Correction Scale/ SSI (0-20 units) Q4 hours     Note Novolog SSI Q4 hours started yesterday at 1pm.  CBGs much improved since Midnight.  Starting Protein drinks today per MD note.  Agree with current Insulin orders.  Note patient saw her PCP (Dr. Jacquiline Doealeb [name] with [facility]) on 07/29/17 prior to surgery.  Per PCP's notes from that visit: "A1c improved to 7.5 from 9.4.  Continue Trulicity and metformin at current doses.  Anticipate improvement after her bariatric surgery.  We will continue her medications for now.  Discussed that both these medications will help her keep her weight off.  She will follow-up with me in 3 months.  Consider decreasing dose or stopping medications depending on her glycemic response to bariatric surgery."   Addendum 1255pm: Spoke w/ pt earlier this AM.  Discussed plans for Metformin and Trulicity Post-Op.  Pt stated her PCP told her to continue her meds after surgery as tolerated.  Plans to follow-up with PCP  in 3 months.  Discussed with pt that Metformin may be difficult to take since she is not able to eat solid foods yet (Metformin may cause GI upset).  Encouraged pt to take both of her DM meds as she can and to call her PCP's office if she has issues with the Metformin.  Discussed with pt that these 2 medications have low risk of Hypoglycemia, however, encouraged pt to be vigilant for HYPO events.  Reviewed signs and symptoms of Hypoglycemia and how to treat at home (4 ounces of fruit juice and recheck CBG in 15 minutes).  Encouraged pt to call her PCP if she has issues with HYPO at home.    --Will follow patient during hospitalization--  Ambrose FinlandJeannine Johnston Oliana Gowens RN, MSN, CDE Diabetes Coordinator Inpatient Glycemic Control Team Team Pager: [phone] (8a-5p)

## 2017-08-12 NOTE — Progress Notes (Signed)
[facility] Office:  [phone] General Surgery Progress Note   LOS: 1 day  POD -  1 Day Post-Op  Chief Complaint: Morbid obesity  Assessment and Plan: 1.  LAPAROSCOPIC ROUX-EN-Y GASTRIC BYPASS WITH UPPER ENDOSCOPY - 07/14/2017 - [employer] [employer]  Start protein drinks today.  Progressing well.  2.  Nodular changes in liver 3.  Left hydrosalpinx 4.  DM x 5 years   On Trulicity and metformin     BS - 148 - 08/12/2017 5. History of depression 6. Psoriasis 7.  DVT prophylaxis - SQ Heparin   Active Problems:   Morbid obesity with body mass index (BMI) of 50.0 to 59.9 in adult [facility](HCC)  Subjective:  Doing okay.  PO intake modest.  Not anxious to go home.  Husband at bedside.  Objective:   Vitals:   08/12/17 0123 08/12/17 0515  BP: 136/72 127/81  Pulse: 97 78  Resp: 18 18  Temp: (!) 100.4 F (38 C) 98.6 F (37 C)  SpO2: 92% 92%     Intake/Output from previous day:  03/19 0701 - 03/20 0700 In: 4826.7 [P.O.:510; I.V.:4266.7; IV Piggyback:50] Out: 3225 [Urine:3200; Blood:25]  Intake/Output this shift:  No intake/output data recorded.   Physical Exam:   General: Obese F who is alert and oriented.    HEENT: Normal. Pupils equal. .   Lungs: Clear.  IS - 1,500 cc.   Abdomen: Soft.  BS present.   Wound: Clean   Lab Results:    Recent Labs    08/11/17 1706 08/12/17 0518  WBC  --  8.8  HGB 13.9 13.1  HCT 41.0 39.7  PLT  --  173    BMET  No results for input(s): NA, K, CL, CO2, GLUCOSE, BUN, CREATININE, CALCIUM in the last 72 hours.  PT/INR  No results for input(s): LABPROT, INR in the last 72 hours.  ABG  No results for input(s): PHART, HCO3 in the last 72 hours.  Invalid input(s): PCO2, PO2   Studies/Results:  No results found.   Anti-infectives:   Anti-infectives (From admission, onward)   Start     Dose/Rate Route Frequency Ordered Stop   08/11/17 0541  cefoTEtan in Dextrose 5% (CEFOTAN) IVPB 2 g     2 g Intravenous On call to O.R.  08/11/17 0541 08/11/17 0759      Ovidio Kinavid Jeanne Terrance, MD, FACS Pager: [phone] [facility] Office: [phone] 08/12/2017

## 2017-08-12 NOTE — Progress Notes (Signed)
Patient alert and oriented, Post op day 1.  Provided support and encouragement.  Encouraged pulmonary toilet, ambulation and small sips of liquids.  All questions answered.  Will continue to monitor. 

## 2017-08-12 NOTE — Plan of Care (Signed)

## 2017-08-13 LAB — GLUCOSE, CAPILLARY
Glucose-Capillary: 147 mg/dL — ABNORMAL HIGH (ref 65–99)
Glucose-Capillary: 129 mg/dL — ABNORMAL HIGH (ref 65–99)
Glucose-Capillary: 152 mg/dL — ABNORMAL HIGH (ref 65–99)

## 2017-08-13 LAB — CBC WITH DIFFERENTIAL/PLATELET
Basophils Absolute: 0 10*3/uL (ref 0.0–0.1)
Basophils Relative: 0 %
Eosinophils Absolute: 0.1 10*3/uL (ref 0.0–0.7)
Eosinophils Relative: 1 %
HCT: 38.9 % (ref 36.0–46.0)
Hemoglobin: 13.1 g/dL (ref 12.0–15.0)
Lymphocytes Relative: 27 %
Lymphs Abs: 2.5 10*3/uL (ref 0.7–4.0)
MCH: 30 pg (ref 26.0–34.0)
MCHC: 33.7 g/dL (ref 30.0–36.0)
MCV: 89.2 fL (ref 78.0–100.0)
Monocytes Absolute: 0.9 10*3/uL (ref 0.1–1.0)
Monocytes Relative: 10 %
Neutro Abs: 5.5 10*3/uL (ref 1.7–7.7)
Neutrophils Relative %: 62 %
Platelets: 171 10*3/uL (ref 150–400)
RBC: 4.36 MIL/uL (ref 3.87–5.11)
RDW: 13.4 % (ref 11.5–15.5)
WBC: 9.1 10*3/uL (ref 4.0–10.5)

## 2017-08-13 NOTE — Discharge Instructions (Signed)
° ° ° °GASTRIC BYPASS/SLEEVE ° Home Care Instructions ° ° These instructions are to help you care for yourself when you go home. ° °Call: If you have any problems. °• Call [phone] and ask for the surgeon on call °• If you need immediate help, come to the ER at [place].  °• Tell the ER staff that you are a new post-op gastric bypass or gastric sleeve patient °  °Signs and symptoms to report: • Severe vomiting or nausea °o If you cannot keep down clear liquids for longer than 1 day, call your surgeon  °• Abdominal pain that does not get better after taking your pain medication °• Fever over 100.4° F with chills °• Heart beating over 100 beats a minute °• Shortness of breath at rest °• Chest pain °•  Redness, swelling, drainage, or foul odor at incision (surgical) sites °•  If your incisions open or pull apart °• Swelling or pain in calf (lower leg) °• Diarrhea (Loose bowel movements that happen often), frequent watery, uncontrolled bowel movements °• Constipation, (no bowel movements for 3 days) if this happens: Pick one °o Milk of Magnesia, 2 tablespoons by mouth, 3 times a day for 2 days if needed °o Stop taking Milk of Magnesia once you have a bowel movement °o Call your doctor if constipation continues °Or °o Miralax  (instead of Milk of Magnesia) following the label instructions °o Stop taking Miralax once you have a bowel movement °o Call your doctor if constipation continues °• Anything you think is not normal °  °Normal side effects after surgery: • Unable to sleep at night or unable to focus °• Irritability or moody °• Being tearful (crying) or depressed °These are common complaints, possibly related to your anesthesia medications that put you to sleep, stress of surgery, and change in lifestyle.  This usually goes away a few weeks after surgery.  If these feelings continue, call your primary care doctor. °  °Wound Care: You may have surgical glue, steri-strips, or staples over your incisions after  surgery °• Surgical glue:  Looks like a clear film over your incisions and will wear off a little at a time °• Steri-strips: Strips of tape over your incisions. You may notice a yellowish color on the skin under the steri-strips. This is used to make the   steri-strips stick better. Do not pull the steri-strips off - let them fall off °• Staples: Staples may be removed before you leave the hospital °o If you go home with staples, call [facility], [phone] at for an appointment with your surgeon’s nurse to have staples removed 10 days after surgery. °• Showering: You may shower two (2) days after your surgery unless your surgeon tells you differently °o Wash gently around incisions with warm soapy water, rinse well, and gently pat dry  °o No tub baths until staples are removed, steri-strips fall off or glue is gone.  °  °Medications: • Medications should be liquid or crushed if larger than the size of a dime °• Extended release pills (medication that release a little bit at a time through the day) should NOT be crushed or cut. (examples include XL, ER, DR, SR) °• Depending on the size and number of medications you take, you may need to space (take a few throughout the day)/change the time you take your medications so that you do not over-fill your pouch (smaller stomach) °• Make sure you follow-up with your primary care doctor to   make medication changes needed during rapid weight loss and life-style changes °• If you have diabetes, follow up with the doctor that orders your diabetes medication(s) within one week after surgery and check your blood sugar regularly. °• Do not drive while taking prescription pain medication  °• It is ok to take Tylenol by the bottle instructions with your pain medicine or instead of your pain medicine as needed.  DO NOT TAKE NSAIDS (EXAMPLES OF NSAIDS:  IBUPROFREN/ NAPROXEN)  °Diet:                    First 2 Weeks ° You will see the dietician t about two (2) weeks  after your surgery. The dietician will increase the types of foods you can eat if you are handling liquids well: °• If you have severe vomiting or nausea and cannot keep down clear liquids lasting longer than 1 day, call your surgeon @ ([phone]) °Protein Shake °• Drink at least 2 ounces of shake 5-6 times per day °• Each serving of protein shakes (usually 8 - 12 ounces) should have: °o 15 grams of protein  °o And no more than 5 grams of carbohydrate  °• Goal for protein each day: °o Men = 80 grams per day °o Women = 60 grams per day °• Protein powder may be added to fluids such as non-fat milk or Lactaid milk or unsweetened Soy/Almond milk (limit to 35 grams added protein powder per serving) ° °Hydration °• Slowly increase the amount of water and other clear liquids as tolerated (See Acceptable Fluids) °• Slowly increase the amount of protein shake as tolerated  °•  Sip fluids slowly and throughout the day.  Do not use straws. °• May use sugar substitutes in small amounts (no more than 6 - 8 packets per day; i.e. Splenda) ° °Fluid Goal °• The first goal is to drink at least 8 ounces of protein shake/drink per day (or as directed by the nutritionist); some examples of protein shakes are Syntrax Nectar, Adkins Advantage, EAS Edge HP, and Unjury. See handout from pre-op Bariatric Education Class: °o Slowly increase the amount of protein shake you drink as tolerated °o You may find it easier to slowly sip shakes throughout the day °o It is important to get your proteins in first °• Your fluid goal is to drink 64 - 100 ounces of fluid daily °o It may take a few weeks to build up to this °• 32 oz (or more) should be clear liquids  °And  °• 32 oz (or more) should be full liquids (see below for examples) °• Liquids should not contain sugar, caffeine, or carbonation ° °Clear Liquids: °• Water or Sugar-free flavored water (i.e. Fruit H2O, Propel) °• Decaffeinated coffee or tea (sugar-free) °• Crystal Lite, Wyler’s Lite,  Minute Maid Lite °• Sugar-free Jell-O °• Bouillon or broth °• Sugar-free Popsicle:   *Less than 20 calories each; Limit 1 per day ° °Full Liquids: °Protein Shakes/Drinks + 2 choices per day of other full liquids °• Full liquids must be: °o No More Than 15 grams of Carbs per serving  °o No More Than 3 grams of Fat per serving °• Strained low-fat cream soup (except Cream of Potato or Tomato) °• Non-Fat milk °• Fat-free Lactaid Milk °• Unsweetened Soy Or Unsweetened Almond Milk °• Low Sugar yogurt (Dannon Lite & Fit, Greek yogurt; Oikos Triple Zero; Chobani Simply 100; Yoplait 100 calorie Greek - No Fruit on the Bottom) ° °  °Vitamins   and Minerals  Start 1 day after surgery unless otherwise directed by your surgeon  2 Chewable Bariatric Specific Multivitamin / Multimineral Supplement with iron (Example: Bariatric Advantage Multi EA)  Chewable Calcium with Vitamin D-3 (Example: 3 Chewable Calcium Plus 600 with Vitamin D-3) o Take 500 mg three (3) times a day for a total of 1500 mg each day o Do not take all 3 doses of calcium at one time as it may cause constipation, and you can only absorb 500 mg  at a time  o Do not mix multivitamins containing iron with calcium supplements; take 2 hours apart  Menstruating women and those with a history of anemia (a blood disease that causes weakness) may need extra iron o Talk with your doctor to see if you need more iron  Do not stop taking or change any vitamins or minerals until you talk to your dietitian or surgeon  Your Dietitian and/or surgeon must approve all vitamin and mineral supplements   Activity and Exercise: Limit your physical activity as instructed by your doctor.  It is important to continue walking at home.  During this time, use these guidelines:  Do not lift anything greater than ten (10) pounds for at least two (2) weeks  Do not go back to work or drive until Designer, industrial/product says you can  You may have sex when you feel comfortable  o It is  VERY important for female patients to use a reliable birth control method; fertility often increases after surgery  o All hormonal birth control will be ineffective for 30 days after surgery due to medications given during surgery a barrier method must be used. o Do not get pregnant for at least 18 months  Start exercising as soon as your doctor tells you that you can o Make sure your doctor approves any physical activity  Start with a simple walking program  Walk 5-15 minutes each day, 7 days per week.   Slowly increase until you are walking 30-45 minutes per day Consider joining our BELT program. [phone] or email [email or web address] .[email or web address]   Special Instructions Things to remember:  Use your CPAP when sleeping if this applies to you   [facility] has two free Bariatric Surgery Support Groups that meet monthly o The 3rd Thursday of each month, 6 pm, [facility]  o The 2nd Friday of each month, 11:45 am in the private dining room in the basement of [facility]  It is very important to keep all follow up appointments with your surgeon, dietitian, primary care physician, and behavioral health practitioner  Routine follow up schedule with your surgeon include appointments at 2-3 weeks, 6-8 weeks, 6 months, and 1 year at a minimum.  Your surgeon may request to see you more often.   o After the first year, please follow up with your bariatric surgeon and dietitian at least once a year in order to maintain best weight loss results [facility]: [phone] [facility]: [phone] Bariatric Nurse Coordinator: [phone]      Reviewed and Endorsed  by [facility] Patient Education Committee, June, 2016 Edits Approved: Aug, 2018   Fingerstick glucose (sugar) goals for home: Before meals: 80-130 mg/dl 2-Hours after meals: less than 180 mg/dl Hemoglobin M0N goal: 7% or less  Symptoms of  Hypoglycemia: Silly, Sweaty, Shaky Check sugar if you have your meter.  If near or less than 70 mg/dl, treat with 1/2 cup juice or take  glucose tablets Check sugar 15 minutes after treatment.  If sugar still near or less than 70 mg/al and symptomatic, treat again with juice.

## 2017-08-13 NOTE — Discharge Summary (Signed)
Physician Discharge Summary  Patient ID:  Bonnie Glenn  MRN: [id number]  DOB/AGE: 28/05/1990 28 y.o.  Admit date: 08/11/2017 Discharge date: 08/13/2017  Discharge Diagnoses:  1.  Morbid obesity  (weight 306, BMI of 50.9) 2.  Nodular changes in liver (mild to moderate cirrhosis) [photo in chart] 3.  Left hydrosalpinx  [photo in chart] 4.  DM x 5 years         On Trulicity and metformin  5. History of depression 6. Psoriasis   Active Problems:   Morbid obesity with body mass index (BMI) of 50.0 to 59.9 in adult [facility](HCC)  Operation: Procedure(s):  LAPAROSCOPIC ROUX-EN-Y GASTRIC BYPASS WITH UPPER ENDOSCOPY on 08/11/2017 - D. Ezzard StandingNewman  Discharged Condition: good  Hospital Course: Bonnie Glenn is an 27 y.o. female whose primary care physician is Ardith DarkParker, Caleb M, MD and who was admitted 08/11/2017 with a chief complaint of No chief complaint on file. Marland Kitchen.   She was brought to the operating room on 08/11/2017 and underwent  LAPAROSCOPIC ROUX-EN-Y GASTRIC BYPASS WITH UPPER ENDOSCOPY.    At surgery, she was noted to have nodular changes in her liver consistent with mild to moderate cirrhosis.  Photos were taken and placed in the chart.  She is now two days post op. She is tolerating protein drinks and is ready to go home.  Her husband is at the bedside.  The discharge instructions were reviewed with the patient.  Consults: None  Significant Diagnostic Studies: Results for orders placed or performed during the hospital encounter of 08/11/17  Pregnancy, urine STAT morning of surgery  Result Value Ref Range   Preg Test, Ur NEGATIVE NEGATIVE  Glucose, capillary  Result Value Ref Range   Glucose-Capillary 258 (H) 65 - 99 mg/dL   Comment 1 Notify RN   Glucose, capillary  Result Value Ref Range   Glucose-Capillary 169 (H) 65 - 99 mg/dL   Comment 1 Document in Chart    Comment 2 Call MD NNP PA CNM   Hemoglobin and hematocrit, blood  Result Value Ref Range   Hemoglobin 13.9 12.0 - 15.0 g/dL    HCT 44.041.0 10.236.0 - 72.546.0 %  Glucose, capillary  Result Value Ref Range   Glucose-Capillary 268 (H) 65 - 99 mg/dL  Glucose, capillary  Result Value Ref Range   Glucose-Capillary 269 (H) 65 - 99 mg/dL  Glucose, capillary  Result Value Ref Range   Glucose-Capillary 210 (H) 65 - 99 mg/dL  CBC WITH DIFFERENTIAL  Result Value Ref Range   WBC 8.8 4.0 - 10.5 K/uL   RBC 4.44 3.87 - 5.11 MIL/uL   Hemoglobin 13.1 12.0 - 15.0 g/dL   HCT 36.639.7 44.036.0 - 34.746.0 %   MCV 89.4 78.0 - 100.0 fL   MCH 29.5 26.0 - 34.0 pg   MCHC 33.0 30.0 - 36.0 g/dL   RDW 42.513.3 95.611.5 - 38.715.5 %   Platelets 173 150 - 400 K/uL   Neutrophils Relative % 66 %   Neutro Abs 5.8 1.7 - 7.7 K/uL   Lymphocytes Relative 23 %   Lymphs Abs 2.0 0.7 - 4.0 K/uL   Monocytes Relative 10 %   Monocytes Absolute 0.9 0.1 - 1.0 K/uL   Eosinophils Relative 1 %   Eosinophils Absolute 0.0 0.0 - 0.7 K/uL   Basophils Relative 0 %   Basophils Absolute 0.0 0.0 - 0.1 K/uL  Glucose, capillary  Result Value Ref Range   Glucose-Capillary 214 (H) 65 - 99 mg/dL  Glucose, capillary  Result Value Ref Range   Glucose-Capillary 137 (H) 65 - 99 mg/dL  Glucose, capillary  Result Value Ref Range   Glucose-Capillary 148 (H) 65 - 99 mg/dL  Glucose, capillary  Result Value Ref Range   Glucose-Capillary 184 (H) 65 - 99 mg/dL  Glucose, capillary  Result Value Ref Range   Glucose-Capillary 167 (H) 65 - 99 mg/dL  Glucose, capillary  Result Value Ref Range   Glucose-Capillary 154 (H) 65 - 99 mg/dL  CBC with Differential  Result Value Ref Range   WBC 9.1 4.0 - 10.5 K/uL   RBC 4.36 3.87 - 5.11 MIL/uL   Hemoglobin 13.1 12.0 - 15.0 g/dL   HCT 16.1 09.6 - 04.5 %   MCV 89.2 78.0 - 100.0 fL   MCH 30.0 26.0 - 34.0 pg   MCHC 33.7 30.0 - 36.0 g/dL   RDW 40.9 81.1 - 91.4 %   Platelets 171 150 - 400 K/uL   Neutrophils Relative % 62 %   Neutro Abs 5.5 1.7 - 7.7 K/uL   Lymphocytes Relative 27 %   Lymphs Abs 2.5 0.7 - 4.0 K/uL   Monocytes Relative 10 %   Monocytes  Absolute 0.9 0.1 - 1.0 K/uL   Eosinophils Relative 1 %   Eosinophils Absolute 0.1 0.0 - 0.7 K/uL   Basophils Relative 0 %   Basophils Absolute 0.0 0.0 - 0.1 K/uL  Glucose, capillary  Result Value Ref Range   Glucose-Capillary 125 (H) 65 - 99 mg/dL  Glucose, capillary  Result Value Ref Range   Glucose-Capillary 129 (H) 65 - 99 mg/dL  Glucose, capillary  Result Value Ref Range   Glucose-Capillary 152 (H) 65 - 99 mg/dL    Dg Ugi  W/kub  Result Date: 07/17/2017 CLINICAL DATA:  Preop for bariatric surgery EXAM: UPPER GI SERIES WITH KUB TECHNIQUE: After obtaining a scout radiograph a routine upper GI series was performed using thin density barium FLUOROSCOPY TIME:  Fluoroscopy Time:  2 minutes and 54 seconds Radiation Exposure Index (if provided by the fluoroscopic device): 123.4 mGy Number of Acquired Spot Images: 1 COMPARISON:  None. FINDINGS: Normal esophageal motility. No intrinsic or extrinsic lesions are identified. There is a very small sliding-type hiatal hernia but no GE reflux was demonstrated. Normal appearance of the stomach. The mucosal folds are normal. No ulceration or obvious inflammation. Normal emptying into the duodenum. Slightly aberrancy of the duodenal course. The third portion of the duodenum comes to the midline but does not cross the midline and into the left upper quadrant. The small bowel loops course inferiorly down into the lower abdomen. This is probably some type of malrotation. On the scout film the colon appears to be normally positioned. IMPRESSION: 1. Normal esophageal motility. Very small sliding-type hiatal hernia but no GE reflux. 2. Normal appearance of the stomach. 3. 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 The colon appears normally positioned. Electronically Signed   By: Rudie Meyer M.D.   On: 07/17/2017 09:40    Discharge Exam:  Vitals:   08/13/17 0200 08/13/17 0557  BP: 112/65 117/68  Pulse: 81 78  Resp: 18 18  Temp: 98.4 F (36.9 C) 98.4  F (36.9 C)  SpO2: 94% 96%    General: Obese WF who is alert and generally healthy appearing.  Lungs: Clear to auscultation and symmetric breath sounds. Heart:  RRR. No murmur or rub. Abdomen: Soft. No mass.  No hernia. Normal bowel sounds. Her incisions look good.  Discharge Medications:   Allergies as  of 08/13/2017   No Known Allergies     Medication List    TAKE these medications   acetaminophen 500 MG tablet Commonly known as:  TYLENOL Take 1,000 mg by mouth every 6 (six) hours as needed (for pain.).   BELVIQ XR 20 MG Tb24 Generic drug:  Lorcaserin HCl ER Take 20 mg by mouth daily.   citalopram 20 MG tablet Commonly known as:  CELEXA Take 1 tablet (20 mg total) by mouth daily.   clobetasol ointment 0.05 % Commonly known as:  TEMOVATE Apply 1 application topically 2 (two) times daily.   Dulaglutide 1.5 MG/0.5ML Sopn Inject into the skin weekly. What changed:    how much to take  how to take this  when to take this  additional instructions   glucose blood test strip Use daily as needed to check blood sugar.   Melatonin 5 MG Tabs Take 5 mg by mouth at bedtime as needed (for sleep.).   metFORMIN 500 MG 24 hr tablet Commonly known as:  GLUCOPHAGE-XR Take 2 tablets (1,000 mg total) by mouth 2 (two) times daily.   multivitamin with minerals Tabs tablet Take 1 tablet by mouth daily.   ondansetron 4 MG disintegrating tablet Commonly known as:  ZOFRAN-ODT Take 4 mg by mouth every 6 (six) hours as needed. for nausea   pantoprazole 40 MG tablet Commonly known as:  PROTONIX Take 40 mg by mouth daily.   Vitamin D3 3000 units Tabs Take 3,000 Units by mouth daily.       Disposition: Discharge disposition: 01-Home or Self Care       Discharge Instructions    Ambulate hourly while awake   Complete by:  As directed    Call MD for:  difficulty breathing, headache or visual disturbances   Complete by:  As directed    Call MD for:  persistant  dizziness or light-headedness   Complete by:  As directed    Call MD for:  persistant nausea and vomiting   Complete by:  As directed    Call MD for:  redness, tenderness, or signs of infection (pain, swelling, redness, odor or green/yellow discharge around incision site)   Complete by:  As directed    Call MD for:  severe uncontrolled pain   Complete by:  As directed    Call MD for:  temperature >101 F   Complete by:  As directed    Diet bariatric full liquid   Complete by:  As directed    Incentive spirometry   Complete by:  As directed    Perform hourly while awake         Signed: Ovidio Kin, M.D., [facility] Office:  [phone]  08/13/2017, 8:09 AM

## 2017-08-13 NOTE — Progress Notes (Signed)
Patient alert and oriented, pain is controlled. Patient is tolerating fluids, advanced to protein shake today, patient is tolerating well. Reviewed Gastric Bypass discharge instructions with patient and patient is able to articulate understanding. Provided information on BELT program, Support Group and [facility] outpatient pharmacy. All questions answered, will continue to monitor.    

## 2017-08-13 NOTE — Progress Notes (Signed)
Discharge instructions given to patient, all questions answered. Pt was taken to main lobby via wheelchair.

## 2017-08-17 ENCOUNTER — Ambulatory Visit: Payer: BLUE CROSS/BLUE SHIELD | Admitting: Family Medicine

## 2017-08-17 ENCOUNTER — Telehealth (HOSPITAL_COMMUNITY): Payer: Self-pay

## 2017-08-17 NOTE — Telephone Encounter (Addendum)
Patient called to discuss post bariatric surgery follow up questions.  See below:   1.  Tell me about your pain and pain management?taking more tylenol then pain medication.  Pain medication ususally before bed. Medication does relieve pain and she is using less of both.  We discussed that if pain were to be uneffected by pain medication then she would want to call office  2.  Let's talk about fluid intake.  How much total fluid are you taking in?68 ounces of fluid  3.  How much protein have you taken in the last 2 days?60 grams of protein  4.  Have you had nausea?  Tell me about when have experienced nausea and what you did to help?has not experienced any nausea  5.  Has the frequency or color changed with your urine?urinates frequently light in color  6.  Tell me what your incisions look like?no problems with incisions  7.  Have you been passing gas? BM?passing gas has had a bm everyday since discharge  8.  If a problem or question were to arise who would you call?  Do you know contact numbers for [facility], [facility], and [facility]?  9.  How has the walking going?walking every hour and multiple times per day  10.  How are your vitamins and calcium going?  How are you taking them?[facility] no questions with this.  Patient concerned she is eating too much. We discussed her fluid intake is spot on and focus at this point as having 5 meals (Phase II diet meal)  per day as she will during transition to soft protein.  Continuing to reach fluid goals

## 2017-08-19 ENCOUNTER — Encounter: Payer: Self-pay | Admitting: Gastroenterology

## 2017-08-19 ENCOUNTER — Ambulatory Visit (INDEPENDENT_AMBULATORY_CARE_PROVIDER_SITE_OTHER): Payer: BLUE CROSS/BLUE SHIELD | Admitting: Family Medicine

## 2017-08-19 ENCOUNTER — Encounter: Payer: Self-pay | Admitting: Family Medicine

## 2017-08-19 VITALS — BP 132/70 | HR 75 | Temp 97.5°F | Ht 65.0 in | Wt 286.6 lb

## 2017-08-19 DIAGNOSIS — K7460 Unspecified cirrhosis of liver: Secondary | ICD-10-CM

## 2017-08-19 DIAGNOSIS — E1165 Type 2 diabetes mellitus with hyperglycemia: Secondary | ICD-10-CM

## 2017-08-19 DIAGNOSIS — R300 Dysuria: Secondary | ICD-10-CM

## 2017-08-19 LAB — POCT URINALYSIS DIPSTICK
Bilirubin, UA: NEGATIVE
Glucose, UA: NEGATIVE
Nitrite, UA: NEGATIVE
PH UA: 5.5 (ref 5.0–8.0)
SPEC GRAV UA: 1.02 (ref 1.010–1.025)
UROBILINOGEN UA: 1 U/dL

## 2017-08-19 LAB — PROTIME-INR
INR: 1.2 ratio — AB (ref 0.8–1.0)
PROTHROMBIN TIME: 13 s (ref 9.6–13.1)

## 2017-08-19 MED ORDER — CEPHALEXIN 500 MG PO CAPS: 500.0000 mg | ORAL_CAPSULE | Freq: Two times a day (BID) | ORAL | 0 refills | Status: AC

## 2017-08-19 MED ORDER — METFORMIN HCL 1000 MG PO TABS: 1000.0000 mg | ORAL_TABLET | Freq: Two times a day (BID) | ORAL | 3 refills | Status: AC

## 2017-08-19 NOTE — Progress Notes (Signed)
    Subjective:  Bonnie Glenn is a 27 y.o. female who presents today with a chief complaint of cirrhosis.   HPI:  Liver cirrhosis, new problem Patient underwent gastric bypass procedure last week.  During the procedure, her surgeon noted a nodular appearing liver concerning for cirrhosis.  She has no prior history of cirrhosis or liver infection.  No hematochezia.  No abdominal swelling.  Patient surgeon requested patient be evaluated for other causes of her cirrhosis. Please see below pictures.   Dysuria, new problem Started 2 nights ago.  Has had mild hematuria.  Also with increased frequency.  No fevers or chills.  T2DM, established problem, stable Currently on metformin extended release 1000 mg twice daily.  Also on Trulicity 1.5 mg weekly.  Tolerates both these medications well without side effects, however request that her metformin be switched to immediate release form as she is unable to break the extended release in half.  ROS: Per HPI  PMH: She reports that she has never smoked. She has never used smokeless tobacco. She reports that she does not drink alcohol or use drugs.  Objective:  Physical Exam: BP 132/70 (BP Location: Left Arm)   Pulse 75   Temp (!) 97.5 F (36.4 C)   Ht 5\' 5"  (1.651 m)   Wt 286 lb 9.6 oz (130 kg)   LMP 07/25/2017 (Approximate)   SpO2 98%   BMI 47.69 kg/m   Gen: NAD, resting comfortably CV: RRR with no murmurs appreciated Pulm: NWOB, CTAB with no crackles, wheezes, or rhonchi GI: Morbidly obese, Normal bowel sounds present. Soft, Nontender, Nondistended.      Results for orders placed or performed in visit on 08/19/17 (from the past 24 hour(s))  POCT urinalysis dipstick     Status: Abnormal   Collection Time: 08/19/17  9:57 AM  Result Value Ref Range   Color, UA Amber    Clarity, UA Slightly Cloudy    Glucose, UA Negative    Bilirubin, UA Negative    Ketones, UA (2+) Moderate    Spec Grav, UA 1.020 1.010 - 1.025   Blood, UA (2+)  Moderate    pH, UA 5.5 5.0 - 8.0   Protein, UA (1+) Small    Urobilinogen, UA 1.0 0.2 or 1.0 E.U./dL   Nitrite, UA Negative    Leukocytes, UA Small (1+) (A) Negative   Appearance     Odor     Assessment/Plan:  Cirrhosis of liver without ascites (HCC) Check CMET, INR, and hepatitis panels.  Will refer to GI.  Type 2 diabetes mellitus with hyperglycemia (HCC) Stable. Continue metformin and trulicity. Will send in immediate release form of metformin. Follow up in 2-3 months, anticipate rapid improvement in A1c with recent gastric bypass.   Dysuria UA with possible signs of UTI.  Symptoms may also be related to recent catheterization during her surgery.  We will empirically start Keflex.  Check urine culture.  No signs of systemic illness.  Katina Degreealeb M. Jimmey RalphParker, MD 08/19/2017 10:07 AM

## 2017-08-19 NOTE — Addendum Note (Signed)
Addended by: Felix AhmadiFRANSEN, Sinaya Minogue A on: 08/19/2017 10:45 AM   Modules accepted: Orders

## 2017-08-19 NOTE — Assessment & Plan Note (Signed)
Check CMET, INR, and hepatitis panels.  Will refer to GI.

## 2017-08-19 NOTE — Assessment & Plan Note (Signed)
Stable. Continue metformin and trulicity. Will send in immediate release form of metformin. Follow up in 2-3 months, anticipate rapid improvement in A1c with recent gastric bypass.

## 2017-08-19 NOTE — Patient Instructions (Addendum)
Start the keflex.  We will check a urine culture to confirm the diagnosis.   We will check blood work today and place a referral to the specialist.  I sent in the new form of metformin.  Come back to see me in 3 months, or sooner as needed.  Take care, Dr Jimmey RalphParker

## 2017-08-20 LAB — URINE CULTURE
MICRO NUMBER: 90382644
SPECIMEN QUALITY:: ADEQUATE

## 2017-08-21 ENCOUNTER — Other Ambulatory Visit (INDEPENDENT_AMBULATORY_CARE_PROVIDER_SITE_OTHER): Payer: BLUE CROSS/BLUE SHIELD

## 2017-08-21 DIAGNOSIS — K7460 Unspecified cirrhosis of liver: Secondary | ICD-10-CM

## 2017-08-21 DIAGNOSIS — E1165 Type 2 diabetes mellitus with hyperglycemia: Secondary | ICD-10-CM

## 2017-08-21 NOTE — Addendum Note (Signed)
Addended by: London SheerFRIZZELL, Cohen Boettner T on: 08/21/2017 03:56 PM   Modules accepted: Orders

## 2017-08-21 NOTE — Progress Notes (Signed)
Dr Lavone NeriParker's interpretation of your lab work:  Your urine culture was negative. You can stop your antibiotics.   Please come back soon to have your other blood work drawn.   If you have any additional questions, please give us a call or send us a message through [facility].  Take care, Dr Jimmey RalphParker

## 2017-08-22 LAB — COMPREHENSIVE METABOLIC PANEL
AG RATIO: 1.5 (calc) (ref 1.0–2.5)
ALBUMIN MSPROF: 4.3 g/dL (ref 3.6–5.1)
ALT: 19 U/L (ref 6–29)
AST: 27 U/L (ref 10–30)
Alkaline phosphatase (APISO): 44 U/L (ref 33–115)
BILIRUBIN TOTAL: 2.2 mg/dL — AB (ref 0.2–1.2)
BUN/Creatinine Ratio: 28 (calc) — ABNORMAL HIGH (ref 6–22)
BUN: 13 mg/dL (ref 7–25)
CALCIUM: 10 mg/dL (ref 8.6–10.2)
CO2: 24 mmol/L (ref 20–32)
Chloride: 102 mmol/L (ref 98–110)
Creat: 0.46 mg/dL — ABNORMAL LOW (ref 0.50–1.10)
Globulin: 2.9 g/dL (calc) (ref 1.9–3.7)
Glucose, Bld: 100 mg/dL — ABNORMAL HIGH (ref 65–99)
POTASSIUM: 4.4 mmol/L (ref 3.5–5.3)
SODIUM: 138 mmol/L (ref 135–146)
TOTAL PROTEIN: 7.2 g/dL (ref 6.1–8.1)

## 2017-08-22 LAB — HEPATITIS B SURFACE ANTIBODY,QUALITATIVE: Hep B S Ab: NONREACTIVE

## 2017-08-22 LAB — HEPATITIS C ANTIBODY
Hepatitis C Ab: NONREACTIVE
SIGNAL TO CUT-OFF: 0.1 (ref <1.00)

## 2017-08-22 LAB — HEPATITIS B SURFACE ANTIGEN: Hepatitis B Surface Ag: NONREACTIVE

## 2017-08-22 LAB — HEPATITIS A ANTIBODY, TOTAL: Hepatitis A AB,Total: NONREACTIVE

## 2017-08-23 ENCOUNTER — Other Ambulatory Visit: Payer: Self-pay | Admitting: Family Medicine

## 2017-08-25 ENCOUNTER — Encounter: Payer: BLUE CROSS/BLUE SHIELD | Attending: Surgery | Admitting: Registered"

## 2017-08-25 DIAGNOSIS — Z713 Dietary counseling and surveillance: Secondary | ICD-10-CM | POA: Insufficient documentation

## 2017-08-25 DIAGNOSIS — E119 Type 2 diabetes mellitus without complications: Secondary | ICD-10-CM

## 2017-08-26 ENCOUNTER — Ambulatory Visit (INDEPENDENT_AMBULATORY_CARE_PROVIDER_SITE_OTHER): Payer: BLUE CROSS/BLUE SHIELD

## 2017-08-26 DIAGNOSIS — Z23 Encounter for immunization: Secondary | ICD-10-CM | POA: Diagnosis not present

## 2017-08-28 ENCOUNTER — Ambulatory Visit: Payer: Self-pay | Admitting: Family Medicine

## 2017-08-28 ENCOUNTER — Telehealth: Payer: Self-pay | Admitting: Registered"

## 2017-08-28 NOTE — Telephone Encounter (Signed)
RD called pt to verify fluid intake once restarting soft, solid proteins 2 week post-bariatric surgery.   RD left voicemail.

## 2017-08-28 NOTE — Progress Notes (Signed)
Bariatric Class:  Appt start time: 1530 end time:  1630.  2 Week Post-Operative Nutrition Class  Patient was seen on 08/25/2017 for Post-Operative [facility].   Surgery date: 08/11/2017 Surgery type: RYGB Start weight at [facility]: 299.8 Weight today: 282.4 Weight change: 17.4  Pt states she consuming more than 64 ounces a day. Pt states is taking non extended release metformin now. Pt states her psoriasis has improved. Pt states she had 2 days of nausea and vomiting soon after surgery. Pt states she checks BS 3x/day: 90-115. Pt reports recent A1c as 7.8.  TANITA  BODY COMP RESULTS  08/25/2017   BMI (kg/m^2) 47.0   Fat Mass (lbs) 145.2   Fat Free Mass (lbs) 137.2   Total Body Water (lbs) 102.6   The following the learning objectives were met by the patient during this course:  Identifies Phase 3A (Soft, High Proteins) Dietary Goals and will begin from 2 weeks post-operatively to 2 months post-operatively  Identifies appropriate sources of fluids and proteins   States protein recommendations and appropriate sources post-operatively  Identifies the need for appropriate texture modifications, mastication, and bite sizes when consuming solids  Identifies appropriate multivitamin and calcium sources post-operatively  Describes the need for physical activity post-operatively and will follow MD recommendations  States when to call healthcare provider regarding medication questions or post-operative complications  Handouts given during class include:  Phase 3A: Soft, High Protein Diet Handout  Follow-Up Plan: Patient will follow-up at [facility] in 6 weeks for 2 month post-op nutrition visit for diet advancement per MD.

## 2017-09-21 ENCOUNTER — Telehealth: Payer: Self-pay | Admitting: Registered"

## 2017-09-21 NOTE — Telephone Encounter (Signed)
Hi Bonnie Glenn,  I hope you're doing well! Congratulations to your husband on the new job! Yes, you can certainly come in before 5/10 to ensure you still have insurance coverage and before you relocate. You can call our office next week to reschedule; we close on Fridays at 12pm. I would also recommend getting connected with a bariatric team in [place] to have ongoing follow-up visits. Thank you for reaching out and updating [place] on the recent changes. You should also contact [facility] and update them.   I look forward to seeing you soon! Have a wonderful weekend and happy packing!  Thank you, Mirian Capuchin, MS, RD, LDN [facility]  [facility] Registered Dietitian I Direct Dial: [phone]  Fax: [phone] Website: [email or web address]    -----Original Message----- From: Bonnie Glenn [email or web address]> Sent: Friday, September 18, 2017 11:23 AM To: Adela Lank, Bonnie Glenn [email or web address]> Subject: [External Email]Moving and Diet  *Caution - External email - see footer for warnings*  Good Morning Bonnie Glenn,   I had my Gastric Bypass on 08/11/17. I am currently on the soft protein part of the diet. We just found out that my husband got a new job and we will be moving to [place] in the middle of May. My next follow-up appointment isn't scheduled with you until May 15th and we will be moved by then. I'm not really sure what to do at this point. We will have our insurance until May 10th. I'm not sure if I should schedule an appointment to come in so you can at least tell me what the next part of my diet will be once I reach that point or how I should handle it. Also, I'm curious what you would recommend doing once I get to [place]. Should I try to locate another nutritionist that deals with Bariatric patients when I get there? This move was definitely unexpected and I didn't think something like this would be happening during my recovery process. If you could  respond back or give me a call at [phone], I would greatly appreciate it. I just want to make sure that I am keeping on track with my diet and recovery even with this move.  Thanks, Bonnie Glenn WARNING: This email originated outside of [facility]. Even if this looks like a [employer], it is not. Do not provide your username, password, or any other personal information in response to this or any other email. [facility] will never ask you for your username or password via email. DO NOT CLICK links or attachments unless you are positive the content is safe. If in doubt about the safety of this message, select the [employer] button, which forwards to IT Security.

## 2017-09-25 ENCOUNTER — Encounter: Payer: Self-pay | Admitting: Family Medicine

## 2017-09-25 ENCOUNTER — Ambulatory Visit (INDEPENDENT_AMBULATORY_CARE_PROVIDER_SITE_OTHER): Payer: BLUE CROSS/BLUE SHIELD

## 2017-09-25 DIAGNOSIS — Z23 Encounter for immunization: Secondary | ICD-10-CM | POA: Diagnosis not present

## 2017-09-25 NOTE — Progress Notes (Signed)
Twinrix 1 mL given IM left deltoid, mfg: GSK, lot#: [id number], exp: 11/26/18, ndc: 16109-604-54, pt tolerated well.

## 2017-09-28 ENCOUNTER — Encounter: Payer: Self-pay | Admitting: Family Medicine

## 2017-09-28 ENCOUNTER — Ambulatory Visit: Payer: Self-pay

## 2017-09-28 ENCOUNTER — Ambulatory Visit (INDEPENDENT_AMBULATORY_CARE_PROVIDER_SITE_OTHER): Payer: BLUE CROSS/BLUE SHIELD | Admitting: Family Medicine

## 2017-09-28 VITALS — BP 102/72 | HR 67 | Temp 97.6°F | Resp 14 | Ht 65.0 in | Wt 267.0 lb

## 2017-09-28 DIAGNOSIS — K7460 Unspecified cirrhosis of liver: Secondary | ICD-10-CM | POA: Diagnosis not present

## 2017-09-28 LAB — COMPREHENSIVE METABOLIC PANEL
ALT: 19 U/L (ref 0–35)
AST: 28 U/L (ref 0–37)
Albumin: 4.2 g/dL (ref 3.5–5.2)
Alkaline Phosphatase: 42 U/L (ref 39–117)
BUN: 8 mg/dL (ref 6–23)
CHLORIDE: 105 meq/L (ref 96–112)
CO2: 27 mEq/L (ref 19–32)
Calcium: 9.4 mg/dL (ref 8.4–10.5)
Creatinine, Ser: 0.57 mg/dL (ref 0.40–1.20)
GFR: 134.3 mL/min (ref >60.00)
GLUCOSE: 101 mg/dL — AB (ref 70–99)
POTASSIUM: 4.2 meq/L (ref 3.5–5.1)
SODIUM: 140 meq/L (ref 135–145)
Total Bilirubin: 6.1 mg/dL — ABNORMAL HIGH (ref 0.2–1.2)
Total Protein: 6.7 g/dL (ref 6.0–8.3)

## 2017-09-28 MED ORDER — TRAMADOL HCL 50 MG PO TABS: 50.0000 mg | ORAL_TABLET | Freq: Three times a day (TID) | ORAL | 0 refills | Status: AC | PRN

## 2017-09-28 NOTE — Patient Instructions (Signed)
Was very nice to see you today! We will check blood work today to make sure that your liver function is normal. Please start the tramadol.  This medication has a tendency to make you drowsy and sleepy, please make sure that you are not planning on driving while taking this medication.  Please seek medical care if your pain worsens, or if you start to develop severe nausea/vomiting, severe pain, or any fevers.  I wish you the best of luck with your move.  Take care, Dr Jimmey Ralph

## 2017-09-28 NOTE — Telephone Encounter (Signed)
See note

## 2017-09-28 NOTE — Telephone Encounter (Signed)
Pt. Reporting right sided abdominal pain at her ribs. States her eyes "look yellow." "I have an appointment for GI, but with my symptoms I want to see Dr. Jimmey Ralph."  " It could be my liver." Appointment made for today.  Reason for Disposition . [1] MODERATE pain (e.g., interferes with normal activities) AND [2] pain comes and goes (cramps) AND [3] present > 24 hours  (Exception: pain with Vomiting or Diarrhea - see that Guideline)  Answer Assessment - Initial Assessment Questions 1. LOCATION: "Where does it hurt?"      Right upper abd. 2. RADIATION: "Does the pain shoot anywhere else?" (e.g., chest, back)     No 3. ONSET: "When did the pain begin?" (e.g., minutes, hours or days ago)      This weekend 4. SUDDEN: "Gradual or sudden onset?"     Gradual 5. PATTERN "Does the pain come and go, or is it constant?"    - If constant: "Is it getting better, staying the same, or worsening?"      (Note: Constant means the pain never goes away completely; most serious pain is constant and it progresses)     - If intermittent: "How long does it last?" "Do you have pain now?"     (Note: Intermittent means the pain goes away completely between bouts)     Comes and goes 6. SEVERITY: "How bad is the pain?"  (e.g., Scale 1-10; mild, moderate, or severe)   - MILD (1-3): doesn't interfere with normal activities, abdomen soft and not tender to touch    - MODERATE (4-7): interferes with normal activities or awakens from sleep, tender to touch    - SEVERE (8-10): excruciating pain, doubled over, unable to do any normal activities      5 7. RECURRENT SYMPTOM: "Have you ever had this type of abdominal pain before?" If so, ask: "When was the last time?" and "What happened that time?"      Yes - when she had gallbladder issues 8. CAUSE: "What do you think is causing the abdominal pain?"     Maybe my liver 9. RELIEVING/AGGRAVATING FACTORS: "What makes it better or worse?" (e.g., movement, antacids, bowel movement)    No 10. OTHER SYMPTOMS: "Has there been any vomiting, diarrhea, constipation, or urine problems?"       No 11. PREGNANCY: "Is there any chance you are pregnant?" "When was your last menstrual period?"       No  Protocols used: ABDOMINAL PAIN - [facility]

## 2017-09-28 NOTE — Progress Notes (Signed)
    Subjective:  Bonnie Glenn is a 27 y.o. female who presents today for same-day appointment with a chief complaint of abdominal pain.   HPI:  Abdominal Pain  Started to 3 days ago.  Prior to this had about 1 to 2 weeks of yellow discoloration of her skin and eyes.  Pain located in her right upper quadrant.  She had a hepatitis A/B vaccine just prior to the abdominal pain starting.  Denies any fevers or chills.  No nausea or vomiting.  No treatments tried.  No other obvious alleviating or aggravating factors.  ROS: Per HPI  PMH: She reports that she has never smoked. She has never used smokeless tobacco. She reports that she does not drink alcohol or use drugs.  Objective:  Physical Exam: BP 102/72   Pulse 67   Temp 97.6 F (36.4 C) (Oral)   Resp 14   Ht  (1.651 m)   Wt 267 lb (121.1 kg)   SpO2 97%   BMI 44.43 kg/m   Gen: NAD, resting comfortably HEENT: Mild scleral icterus noted. CV: RRR with no murmurs appreciated Pulm: NWOB, CTAB with no crackles, wheezes, or rhonchi GI: Obese.  Soft.  Mildly tender to palpation in right upper quadrant.  Murphy sign negative. Skin: Mild yellow discoloration of skin noted.  Assessment/Plan:  Right upper quadrant abdominal pain Unclear underlying etiology.  She does have a history of cirrhosis secondary to fatty liver.  It is possible that she has had a mild flareup of hepatitis secondary to her recent vaccines.  Overall she does not have any red flag signs or symptoms.  Her vital signs and abdominal exam are stable.  We will check CMET today to evaluate for signs of liver damage.  We will control her symptoms with a prescription for tramadol-she is not a candidate for Tylenol given her history of cirrhosis and concern for hepatitis.  She is not a candidate for NSAIDs given her recent history of gastric bypass.  Return precautions reviewed.  She has follow-up with her gastroenterologist in 2 days.  Katina Degree. Jimmey Ralph, MD 09/28/2017 9:12 AM

## 2017-09-28 NOTE — Telephone Encounter (Signed)
Patient is in the office now.

## 2017-09-29 ENCOUNTER — Encounter: Payer: BLUE CROSS/BLUE SHIELD | Attending: Surgery | Admitting: Registered"

## 2017-09-29 ENCOUNTER — Other Ambulatory Visit (INDEPENDENT_AMBULATORY_CARE_PROVIDER_SITE_OTHER): Payer: BLUE CROSS/BLUE SHIELD

## 2017-09-29 ENCOUNTER — Encounter: Payer: Self-pay | Admitting: Registered"

## 2017-09-29 ENCOUNTER — Other Ambulatory Visit: Payer: Self-pay

## 2017-09-29 DIAGNOSIS — Z713 Dietary counseling and surveillance: Secondary | ICD-10-CM | POA: Insufficient documentation

## 2017-09-29 DIAGNOSIS — E1129 Type 2 diabetes mellitus with other diabetic kidney complication: Secondary | ICD-10-CM | POA: Diagnosis not present

## 2017-09-29 DIAGNOSIS — R809 Proteinuria, unspecified: Principal | ICD-10-CM

## 2017-09-29 DIAGNOSIS — E119 Type 2 diabetes mellitus without complications: Secondary | ICD-10-CM

## 2017-09-29 LAB — CBC WITH DIFFERENTIAL/PLATELET
Basophils Absolute: 0.1 10*3/uL (ref 0.0–0.1)
Basophils Relative: 1 % (ref 0.0–3.0)
EOS PCT: 2.5 % (ref 0.0–5.0)
Eosinophils Absolute: 0.1 10*3/uL (ref 0.0–0.7)
HEMATOCRIT: 39.1 % (ref 36.0–46.0)
Hemoglobin: 13.3 g/dL (ref 12.0–15.0)
LYMPHS ABS: 1.6 10*3/uL (ref 0.7–4.0)
Lymphocytes Relative: 29.5 % (ref 12.0–46.0)
MCHC: 33.9 g/dL (ref 30.0–36.0)
MCV: 89.1 fl (ref 78.0–100.0)
MONOS PCT: 6.2 % (ref 3.0–12.0)
Monocytes Absolute: 0.3 10*3/uL (ref 0.1–1.0)
NEUTROS ABS: 3.2 10*3/uL (ref 1.4–7.7)
NEUTROS PCT: 60.8 % (ref 43.0–77.0)
PLATELETS: 168 10*3/uL (ref 150.0–400.0)
RBC: 4.39 Mil/uL (ref 3.87–5.11)
RDW: 14.1 % (ref 11.5–15.5)
WBC: 5.3 10*3/uL (ref 4.0–10.5)

## 2017-09-29 NOTE — Patient Instructions (Signed)
Goals:  Follow Phase 3B: High Protein + Non-Starchy Vegetables  Eat 3-6 small meals/snacks, every 3-5 hrs  Increase lean protein foods to meet 60g goal  Increase fluid intake to 64oz +  Avoid drinking 15 minutes before, during and 30 minutes after eating  Aim for >30 min of physical activity daily  

## 2017-09-29 NOTE — Progress Notes (Signed)
Follow-up visit:  7 Weeks Post-Operative RYGB Surgery  Medical Nutrition Therapy:  Appt start time: 8:05 end time:  8:35.  Primary concerns today: Post-operative Bariatric Surgery Nutrition Management.  Non scale victories: down 2 sizes in clothes, less lower back pain, walking more, happier, more energy, more focused; no longer taking diabetes medications  Surgery date: 08/11/2017 Surgery type: RYGB Start weight at [facility]: 299.8 Weight today: 265.2 Weight change: 17.2 from 282.4 (08/25/2017)  Total weight lost: 34.6 lbs Weight loss goal:  lose 75-100 lbs in 1 year; goal of 145 lbs in 2 years, improve quality of life, better managed diabetes   TANITA  BODY COMP RESULTS  08/25/2017 09/29/2017   BMI (kg/m^2) 47.0 44.1   Fat Mass (lbs) 145.2 126.8   Fat Free Mass (lbs) 137.2 138.4   Total Body Water (lbs) 102.6 102.6    Pt states during bariatric surgery they found she had liver damage and currently investigating that with gastroenterologist. Pt states she is moving to [place] on Saturday. RD recommended pt to follow-up with [facility] about relocating as well as getting connected with bariatric program there to have frequent follow-up visits.   Pt states she is having to be more intentional with drinking due to packing and moving. Pt states she vomited 2-4x due to not chewing well and eating fast; has been a struggle for a long time. Pt states she relies on [employer] App for setting alarms and tracking fluid and protein intake.    Preferred Learning Style:   No preference indicated   Learning Readiness:   Ready  Change in progress  24-hr recall: B (AM): protein shake (30g) Snk (AM): cheese stick (6g)  L (PM): 2-3 oz chicken salad (14-21g) Snk (PM): cheese stick (6g) or greek yogurt (15g)  D (PM): 2-3 oz grilled chicken breast (14-21g) Snk (PM): sometimes sugar-free popsicles or greek yogurt (15g)  Fluid intake: vitamin water zero, water with crystal light; 72-80  ounces Estimated total protein intake: 60+ grams  Medications: See list; no longer taking diabetes medications Supplementation: 2 Bariatric Advantage chewable + 3 calcium  CBG monitoring: yes, once/day Average CBG per patient: FBS (80) and after meals (117) Last patient reported A1c: ~8.2  Using straws: yes Drinking while eating: rarely Having you been chewing well: most of the time Chewing/swallowing difficulties: no Changes in vision: no Changes to mood/headaches: no Hair loss/Changes to skin/Changes to nails: no, no, no Any difficulty focusing or concentrating: no Sweating: no Dizziness/Lightheaded: no Palpitations: no  Carbonated beverages: no N/V/D/C/GAS: no, yes when eating too fast, yes when pasta/pasta sauce, no, no Abdominal Pain: no Dumping syndrome: once when eating something she shouldn't have with pasta with pasta sauce Last Lap-Band fill: N/A  Recent physical activity: Cardio with elliptical and stationary bike 20 min, 3x/week  Progress Towards Goal(s):  In progress.  Handouts given during visit include:  Phase IV: High Protein + Non startchy vegetables   Nutritional Diagnosis:  -3.3 Overweight/obesity related to past poor dietary habits and physical inactivity as evidenced by patient w/ recent RYGB surgery following dietary guidelines for continued weight loss.     Intervention:  Nutrition education and counseling. Pt was educated and counseled on Phase IV of the diet as well as beginning to replace shakes with food items.  Goals:  Follow Phase 3B: High Protein + Non-Starchy Vegetables  Eat 3-6 small meals/snacks, every 3-5 hrs  Increase lean protein foods to meet 60g goal  Increase fluid intake to 64oz +  Avoid  drinking 15 minutes before, during and 30 minutes after eating  Aim for >30 min of physical activity daily  Teaching Method Utilized:  Visual Auditory Hands on  Barriers to learning/adherence to lifestyle change: none  identified  Demonstrated degree of understanding via:  Teach Back   Monitoring/Evaluation:  Dietary intake, exercise, lap band fills, and body weight. Follow up prn. Pt is moving to [place] in 4 days. RD informed pt that she is free to call and/or email as needed.

## 2017-09-30 ENCOUNTER — Encounter: Payer: Self-pay | Admitting: Gastroenterology

## 2017-09-30 ENCOUNTER — Other Ambulatory Visit (INDEPENDENT_AMBULATORY_CARE_PROVIDER_SITE_OTHER): Payer: BLUE CROSS/BLUE SHIELD

## 2017-09-30 ENCOUNTER — Other Ambulatory Visit: Payer: BLUE CROSS/BLUE SHIELD

## 2017-09-30 ENCOUNTER — Other Ambulatory Visit: Payer: Self-pay

## 2017-09-30 ENCOUNTER — Ambulatory Visit (INDEPENDENT_AMBULATORY_CARE_PROVIDER_SITE_OTHER): Payer: BLUE CROSS/BLUE SHIELD | Admitting: Gastroenterology

## 2017-09-30 VITALS — BP 118/78 | HR 60 | Ht 65.0 in | Wt 264.0 lb

## 2017-09-30 DIAGNOSIS — K7460 Unspecified cirrhosis of liver: Secondary | ICD-10-CM

## 2017-09-30 LAB — COMPREHENSIVE METABOLIC PANEL
ALK PHOS: 42 U/L (ref 39–117)
ALT: 22 U/L (ref 0–35)
AST: 35 U/L (ref 0–37)
Albumin: 4.2 g/dL (ref 3.5–5.2)
BILIRUBIN TOTAL: 5.6 mg/dL — AB (ref 0.2–1.2)
BUN: 7 mg/dL (ref 6–23)
CO2: 23 meq/L (ref 19–32)
CREATININE: 0.46 mg/dL (ref 0.40–1.20)
Calcium: 9.4 mg/dL (ref 8.4–10.5)
Chloride: 105 mEq/L (ref 96–112)
GFR: 172 mL/min (ref >60.00)
GLUCOSE: 109 mg/dL — AB (ref 70–99)
Potassium: 3.8 mEq/L (ref 3.5–5.1)
Sodium: 138 mEq/L (ref 135–145)
TOTAL PROTEIN: 7 g/dL (ref 6.0–8.3)

## 2017-09-30 LAB — CBC WITH DIFFERENTIAL/PLATELET
BASOS ABS: 0 10*3/uL (ref 0.0–0.1)
Basophils Relative: 0.8 % (ref 0.0–3.0)
EOS ABS: 0.1 10*3/uL (ref 0.0–0.7)
Eosinophils Relative: 2.3 % (ref 0.0–5.0)
HCT: 40.7 % (ref 36.0–46.0)
Hemoglobin: 13.8 g/dL (ref 12.0–15.0)
LYMPHS ABS: 1.5 10*3/uL (ref 0.7–4.0)
LYMPHS PCT: 29.6 % (ref 12.0–46.0)
MCHC: 34 g/dL (ref 30.0–36.0)
MCV: 88.2 fl (ref 78.0–100.0)
Monocytes Absolute: 0.4 10*3/uL (ref 0.1–1.0)
Monocytes Relative: 7.4 % (ref 3.0–12.0)
NEUTROS ABS: 3.1 10*3/uL (ref 1.4–7.7)
NEUTROS PCT: 59.9 % (ref 43.0–77.0)
PLATELETS: 177 10*3/uL (ref 150.0–400.0)
RBC: 4.61 Mil/uL (ref 3.87–5.11)
RDW: 14.2 % (ref 11.5–15.5)
WBC: 5.1 10*3/uL (ref 4.0–10.5)

## 2017-09-30 LAB — HAPTOGLOBIN: Haptoglobin: <8 mg/dL — ABNORMAL LOW (ref 43–212)

## 2017-09-30 LAB — PROTIME-INR
INR: 1.2 ratio — ABNORMAL HIGH (ref 0.8–1.0)
Prothrombin Time: 13.7 s — ABNORMAL HIGH (ref 9.6–13.1)

## 2017-09-30 LAB — BILIRUBIN, FRACTIONATED(TOT/DIR/INDIR)
BILIRUBIN DIRECT: 0.6 mg/dL — AB (ref 0.0–0.2)
BILIRUBIN INDIRECT: 5.2 mg/dL — AB (ref 0.2–1.2)
Total Bilirubin: 5.8 mg/dL — ABNORMAL HIGH (ref 0.2–1.2)

## 2017-09-30 LAB — FERRITIN: FERRITIN: 492 ng/mL — AB (ref 10.0–291.0)

## 2017-09-30 LAB — IGA: IGA: 105 mg/dL (ref 68–378)

## 2017-09-30 NOTE — Patient Instructions (Addendum)
You will get labs drawn today:  total iron, ferritin, TIBC, ANA, AMA, alphafeto protein (AFP), anti smooth muscle antibody, alpha 1 antitrypsin, cerulloplasm, TTG, total IgA level, CBC, CMET, INR. [place] of RUQ, known cirrhosis, check for hepatoma. Please return to see Dr. Christella Hartigan in 2-3 months. Pending your labs (including LDH drawn by PCP yesterday), you may be referred to hematology for hemolysis.   It is important that you have a relatively low salt diet.  High salt diet can cause fluid to accumulate in your legs, abdomen and even around your lungs. You should try to avoid NSAID type over the counter pain medicines as best as possible. Tylenol is safe to take for 'routine' aches and pains, but never take more than 1/2 the dose suggested on the package instructions (never more than 2 grams per day). Avoid alcohol.  Normal BMI (Body Mass Index- based on height and weight) is between 19 and 25. Your BMI today is Body mass index is 43.93 kg/m. Marland Kitchen Please consider follow up  regarding your BMI with your Primary Care Provider.  You have been scheduled for an abdominal ultrasound at [facility] (1st floor of hospital) on 10/05/17 at 10:30. Please arrive 15 minutes prior to your appointment for registration. Make certain not to have anything to eat or drink 6 hours prior to your appointment. Should you need to reschedule your appointment, please contact radiology at [phone]. This test typically takes about 30 minutes to perform.

## 2017-09-30 NOTE — Progress Notes (Signed)
HPI: This is a very pleasant 27 year old woman who was referred to me by Ardith Dark, MD  to evaluate new diagnosis of cirrhosis.    Chief complaint is cirrhosis  She was aware that she intermittently had elevated liver tests including transaminases and total bilirubin.  Labs about a year and a half ago showed total bilirubin of 2.8.  This was presumed from fatty liver disease.  I do not believe she underwent any formal testing however.  She recently had a Roux-en-Y gastric bypass 2 months ago and during the surgery her liver was noted to be cirrhotic.  She has never had encephalopathy, no overt GI bleeding.  She has never had significant trouble with edema.  In the past week or 2 she has noticed a gradual yellowing of her eyes.  Over the weekend she had some mild right upper quadrant pains.  No fevers or chills.    She has lost 40 pounds since her Roux-en-Y gastric bypass 2 months ago    Old Data Reviewed: Labs March 2019: Hepatitis A total antibody negative, hepatitis B surface antigen negative, hepatitis B surface antibody negative, hepatitis C antibody negative.  Total bilirubin 2.9, AST 57, remaining liver tests all completely normal.  inr 1. 2  Labs May 2019:  Total bilirubin 5.8; Indirect 5.2; haptoglobin very low. Hb 13.3    Review of systems: Pertinent positive and negative review of systems were noted in the above HPI section. All other review negative.   Past Medical History:  Diagnosis Date  . Depression   . Diabetes mellitus without complication (HCC)   . 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 PCOS (polycystic ovarian syndrome)   . Phlebitis   . Psoriasis   . Urinary tract infection     Past Surgical History:  Procedure Laterality Date  . CHOLECYSTECTOMY  2013  . EYE SURGERY Left 1998   Blood Clot Removed Due To Injury  . GASTRIC ROUX-EN-Y N/A 08/11/2017   Procedure: LAPAROSCOPIC ROUX-EN-Y GASTRIC BYPASS WITH UPPER  ENDOSCOPY;  Surgeon: Ovidio Kin, MD;  Location: [facility];  Service: General;  Laterality: N/A;  . MOUTH SURGERY  2003  . 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 Calcium 500-100 MG-UNIT CHEW Chew 1,500 mg by mouth daily.    . citalopram (CELEXA) 20 MG tablet Take 1 tablet (20 mg total) by mouth daily. 30 tablet 11  . clobetasol ointment (TEMOVATE) 0.05 % Apply 1 application topically 2 (two) times daily. 30 g 0  . Dulaglutide 1.5 MG/0.5ML SOPN Inject into the skin weekly. (Patient taking differently: Inject 1.5 mg into the skin every Tuesday. ) 4 pen 5  . glucose blood test strip Use daily as needed to check blood sugar. 100 each 12  . metFORMIN (GLUCOPHAGE) 1000 MG tablet Take 1 tablet (1,000 mg total) by mouth 2 (two) times daily with a meal. 180 tablet 3  . Multiple Vitamin (MULTIVITAMIN WITH MINERALS) TABS tablet Take 2 tablets by mouth daily.     . pantoprazole (PROTONIX) 40 MG tablet Take 40 mg by mouth daily.  2  . traMADol (ULTRAM) 50 MG tablet Take 1 tablet (50 mg total) by mouth every 8 (eight) hours as needed for up to 5 days. 15 tablet 0   No current facility-administered medications for this visit.     Allergies as of 09/30/2017  . (No Known Allergies)    Family  History  Problem Relation Age of Onset  . COPD Mother   . Hypertension Mother   . Alcohol abuse Father   . Early death Father   . Heart attack Father   . Clotting disorder Father   . Alcohol abuse Sister   . Drug abuse Sister   . COPD Maternal Grandmother   . Mental illness Maternal Grandmother   . Clotting disorder Maternal Grandmother   . Birth defects Maternal Grandfather   . Esophageal cancer Maternal Grandfather   . Early death Paternal Grandmother     Social History   Socioeconomic History  . Marital status: Married    Spouse name: Not on file  . Number of children: 0  . Years of education: Not on file  . Highest education level:  Not on file  Occupational History  . Occupation: Archivist  Social Needs  . Financial resource strain: Not on file  . Food insecurity:    Worry: Not on file    Inability: Not on file  . Transportation needs:    Medical: Not on file    Non-medical: Not on file  Tobacco Use  . Smoking status: Never Smoker  . Smokeless tobacco: Never Used  Substance and Sexual Activity  . Alcohol use: No    Frequency: Never  . Drug use: No  . Sexual activity: Never    Partners: Male  Lifestyle  . Physical activity:    Days per week: Not on file    Minutes per session: Not on file  . Stress: Not on file  Relationships  . Social connections:    Talks on phone: Not on file    Gets together: Not on file    Attends religious service: Not on file    Active member of club or organization: Not on file    Attends meetings of clubs or organizations: Not on file    Relationship status: Not on file  . Intimate partner violence:    Fear of current or ex partner: Not on file    Emotionally abused: Not on file    Physically abused: Not on file    Forced sexual activity: Not on file  Other Topics Concern  . Not on file  Social History Narrative  . Not on file     Physical Exam: BP 118/78   Pulse 60   Ht  (1.651 m)   Wt 264 lb (119.7 kg)   BMI 43.93 kg/m  Constitutional: generally well-appearing Psychiatric: alert and oriented x3 Eyes: extraocular movements intact Mouth: oral pharynx moist, no lesions Neck: supple no lymphadenopathy Cardiovascular: heart regular rate and rhythm Lungs: clear to auscultation bilaterally Abdomen: soft, nontender, nondistended, no obvious ascites, no peritoneal signs, normal bowel sounds Extremities: no lower extremity edema bilaterally Skin: no lesions on visible extremities   Assessment and plan: 27 y.o. female with recently diagnosed cirrhosis  First, she is undergoing hepatitis A, B immunization series through her primary care physician.   Today I will order a battery of blood tests to look into other potential etiologies of her cirrhosis.  See a list of those labs below.  It is certainly possible that Elita Boone has caused cirrhosis however she is pretty young for even that to it happened.  I will get an ultrasound of her liver and also alpha-fetoprotein to screen for hepatoma.  At some point she will need upper endoscopy to screen for varices.  Currently looks very well compensated however her total bilirubin is increasing  over the past 2 or 3 weeks, this is all indirect by further lab testing.  Her haptoglobin was very low.  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  She is not anemic.  LDH level is pending and if that is very high I think she will need evaluation by hematologist.  She will return to see me in 2 months and sooner if any problems    Please see the "Patient Instructions" section for addition details about the plan.   Rob Bunting, MD [facility] 09/30/2017, 8:54 AM  Cc: Ardith Dark, MD

## 2017-10-01 ENCOUNTER — Other Ambulatory Visit (INDEPENDENT_AMBULATORY_CARE_PROVIDER_SITE_OTHER): Payer: BLUE CROSS/BLUE SHIELD

## 2017-10-01 DIAGNOSIS — K7460 Unspecified cirrhosis of liver: Secondary | ICD-10-CM | POA: Diagnosis not present

## 2017-10-01 LAB — LACTATE DEHYDROGENASE, ISOENZYMES
LD 1: 29 % (ref 19–38)
LD 2: 38 % (ref 30–43)
LD 3: 16 % (ref 16–26)
LD 4: 5 % (ref 3–12)
LD 5: 12 % (ref 3–14)
LDH: 264 U/L — AB (ref 100–200)

## 2017-10-01 LAB — IRON AND TIBC
IRON SATURATION: 33 % (ref 15–55)
IRON: 88 ug/dL (ref 27–159)
Total Iron Binding Capacity: 266 ug/dL (ref 250–450)
UIBC: 178 ug/dL (ref 131–425)

## 2017-10-01 NOTE — Addendum Note (Signed)
Addended by: Felix Ahmadi A on: 10/01/2017 09:08 AM   Modules accepted: Orders

## 2017-10-02 ENCOUNTER — Ambulatory Visit (HOSPITAL_COMMUNITY)
Admission: RE | Admit: 2017-10-02 | Discharge: 2017-10-02 | Disposition: A | Discharge status: Home / Self Care | Payer: BLUE CROSS/BLUE SHIELD | Source: Ambulatory Visit | Attending: Gastroenterology | Admitting: Gastroenterology

## 2017-10-02 DIAGNOSIS — Z9049 Acquired absence of other specified parts of digestive tract: Secondary | ICD-10-CM | POA: Insufficient documentation

## 2017-10-02 DIAGNOSIS — N200 Calculus of kidney: Secondary | ICD-10-CM | POA: Diagnosis not present

## 2017-10-02 DIAGNOSIS — R932 Abnormal findings on diagnostic imaging of liver and biliary tract: Secondary | ICD-10-CM | POA: Insufficient documentation

## 2017-10-02 DIAGNOSIS — K7460 Unspecified cirrhosis of liver: Secondary | ICD-10-CM | POA: Diagnosis present

## 2017-10-02 DIAGNOSIS — R161 Splenomegaly, not elsewhere classified: Secondary | ICD-10-CM | POA: Diagnosis not present

## 2017-10-02 LAB — CBC WITH DIFFERENTIAL/PLATELET
BASOS ABS: 52 {cells}/uL (ref 0–200)
Basophils Relative: 1 %
Eosinophils Absolute: 99 cells/uL (ref 15–500)
Eosinophils Relative: 1.9 %
HEMATOCRIT: 42 % (ref 35.0–45.0)
HEMOGLOBIN: 14.1 g/dL (ref 11.7–15.5)
LYMPHS ABS: 1539 {cells}/uL (ref 850–3900)
MCH: 29.7 pg (ref 27.0–33.0)
MCHC: 33.6 g/dL (ref 32.0–36.0)
MCV: 88.4 fL (ref 80.0–100.0)
MPV: 13.7 fL — ABNORMAL HIGH (ref 7.5–12.5)
Monocytes Relative: 6 %
Neutro Abs: 3198 cells/uL (ref 1500–7800)
Neutrophils Relative %: 61.5 %
Platelets: 213 10*3/uL (ref 140–400)
RBC: 4.75 10*6/uL (ref 3.80–5.10)
RDW: 13 % (ref 11.0–15.0)
Total Lymphocyte: 29.6 %
WBC: 5.2 10*3/uL (ref 3.8–10.8)
WBCMIX: 312 {cells}/uL (ref 200–950)

## 2017-10-02 LAB — BILIRUBIN, FRACTIONATED(TOT/DIR/INDIR)
BILIRUBIN TOTAL: 5.6 mg/dL — AB (ref 0.2–1.2)
Bilirubin, Direct: 0.7 mg/dL — ABNORMAL HIGH (ref 0.0–0.2)
Indirect Bilirubin: 4.9 mg/dL (calc) — ABNORMAL HIGH (ref 0.2–1.2)

## 2017-10-02 LAB — PATHOLOGIST SMEAR REVIEW

## 2017-10-04 LAB — TISSUE TRANSGLUTAMINASE, IGA: (tTG) Ab, IgA: 1 U/mL

## 2017-10-04 LAB — AFP TUMOR MARKER: AFP-Tumor Marker: 3.3 ng/mL

## 2017-10-04 LAB — ANA: Anti Nuclear Antibody(ANA): POSITIVE — AB

## 2017-10-04 LAB — CERULOPLASMIN: Ceruloplasmin: 34 mg/dL (ref 18–53)

## 2017-10-04 LAB — ANTI-NUCLEAR AB-TITER (ANA TITER)

## 2017-10-04 LAB — ANTI-SMOOTH MUSCLE ANTIBODY, IGG: Actin (Smooth Muscle) Antibody (IGG): <20 U (ref <20)

## 2017-10-04 LAB — MITOCHONDRIAL ANTIBODIES: Mitochondrial M2 Ab, IgG: <=20 U

## 2017-10-04 LAB — ALPHA-1-ANTITRYPSIN: A1 ANTITRYPSIN SER: 157 mg/dL (ref 83–199)

## 2017-10-05 ENCOUNTER — Other Ambulatory Visit: Payer: Self-pay

## 2017-10-05 ENCOUNTER — Ambulatory Visit (HOSPITAL_COMMUNITY): Payer: BLUE CROSS/BLUE SHIELD

## 2017-10-05 DIAGNOSIS — R899 Unspecified abnormal finding in specimens from other organs, systems and tissues: Secondary | ICD-10-CM

## 2017-10-05 DIAGNOSIS — R17 Unspecified jaundice: Secondary | ICD-10-CM

## 2017-10-05 DIAGNOSIS — R718 Other abnormality of red blood cells: Secondary | ICD-10-CM

## 2017-10-05 DIAGNOSIS — K7460 Unspecified cirrhosis of liver: Secondary | ICD-10-CM

## 2017-10-07 ENCOUNTER — Ambulatory Visit: Payer: BLUE CROSS/BLUE SHIELD | Admitting: Registered"

## 2017-10-29 ENCOUNTER — Ambulatory Visit: Payer: BLUE CROSS/BLUE SHIELD | Admitting: Family Medicine

## 2017-11-05 ENCOUNTER — Telehealth: Payer: Self-pay

## 2017-11-05 NOTE — Telephone Encounter (Signed)
From: Gloris HamBrailsford, Andrea Y Sent: 11/05/2017   8:53 AM To: Loretha StaplerPatty L Armie Moren, RN  Good morning Dorna Mallet!  I haven't been able to get in touch with the patient. Can you give her a call and have her call me directly at [phone] to schedule an appointment?   Thank you.  Left message on machine to call back

## 2017-11-06 NOTE — Telephone Encounter (Signed)
I have been unable to reach the pt as well I did leave a message to have her contact the number provided.  Dr Alinda DoomsJacobs FYI

## 2017-11-13 ENCOUNTER — Encounter: Payer: Self-pay | Admitting: Family Medicine

## 2017-12-04 ENCOUNTER — Ambulatory Visit: Payer: BLUE CROSS/BLUE SHIELD | Admitting: Gastroenterology

## 2017-12-29 ENCOUNTER — Encounter: Payer: Self-pay | Admitting: Gastroenterology

## 2017-12-29 ENCOUNTER — Encounter: Payer: Self-pay | Admitting: Family Medicine

## 2018-05-13 IMAGING — US US ABDOMEN COMPLETE
1 series · 14 of 25 positions shown · non-contrast
Comparison: None.

CLINICAL DATA: Cirrhosis of the liver. Jaundice. Previous gastric
bypass and cholecystectomy.

EXAM:
ABDOMEN ULTRASOUND COMPLETE

[Series 1: us abdomen complete · 0.25mm/px · 14 of 75 slices shown]
[im 1/75]
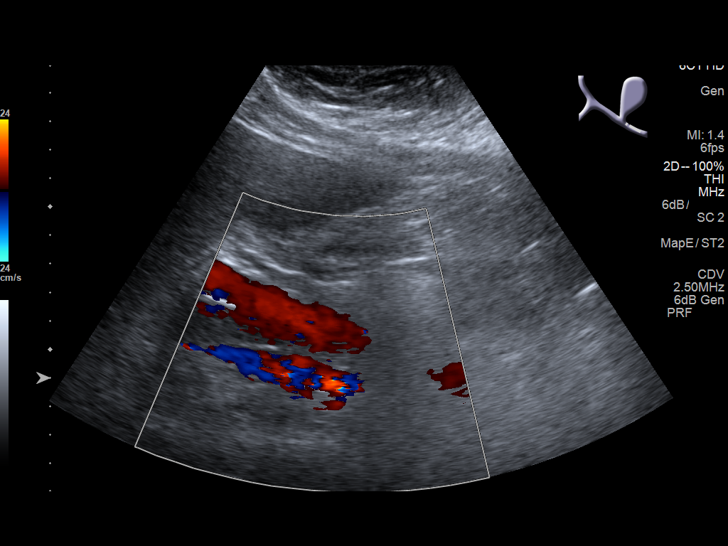
[im 7/75]
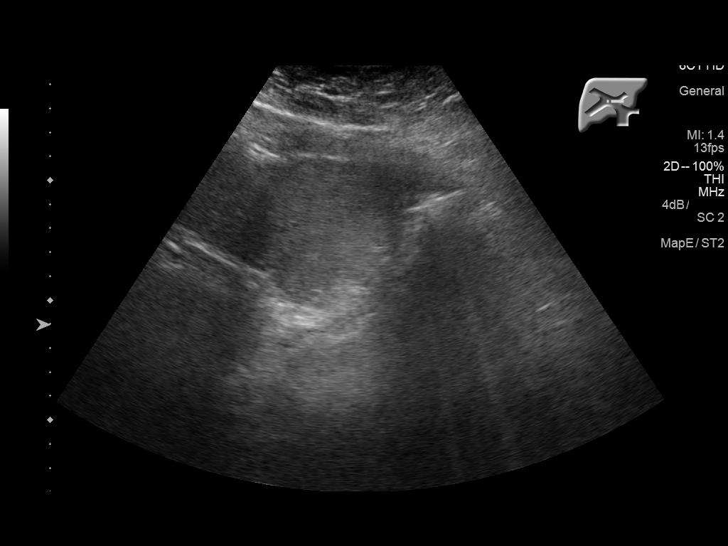
[im 13/75]
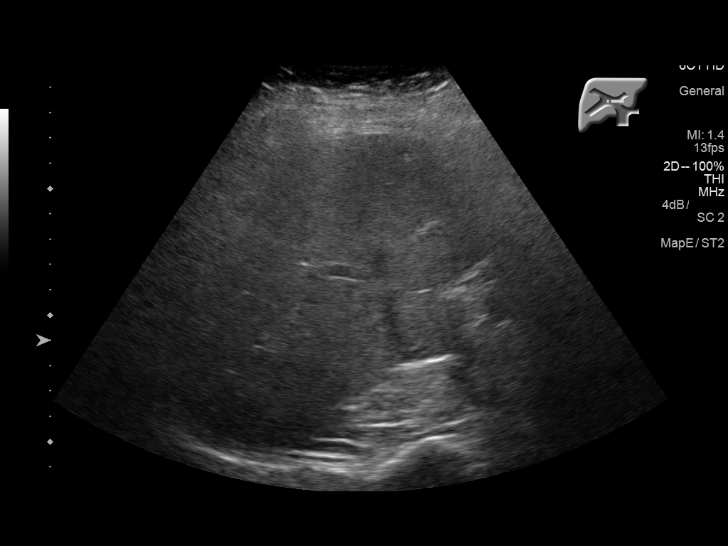
[im 19/75]
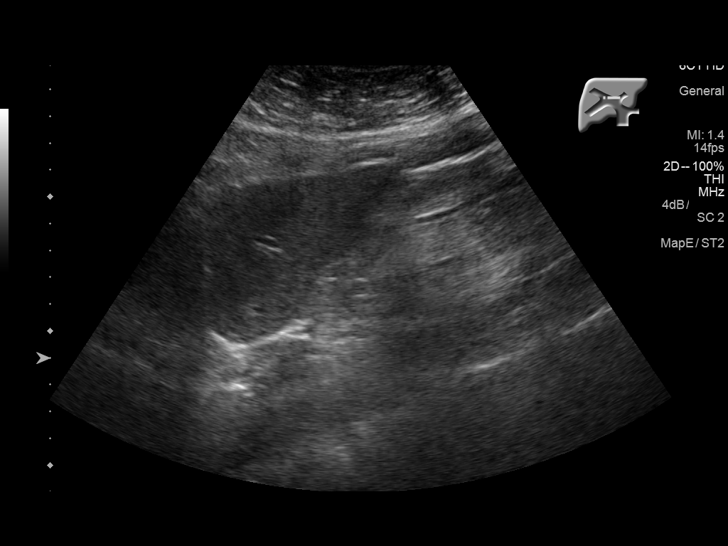
[im 25/75]
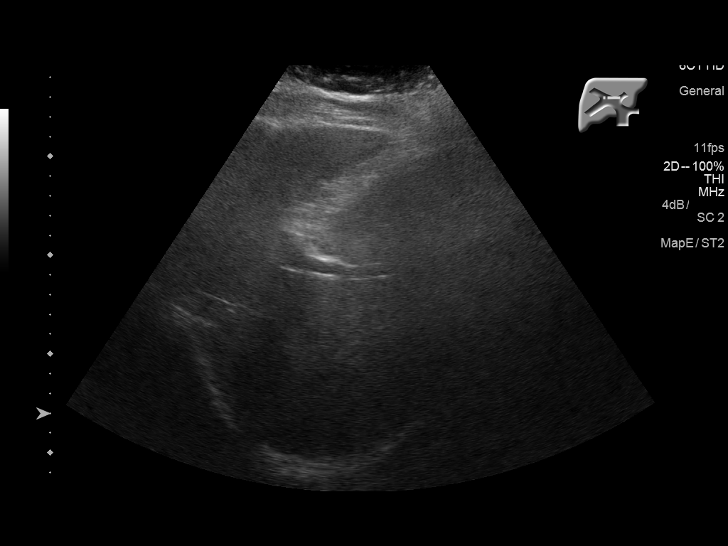
[im 28/75]
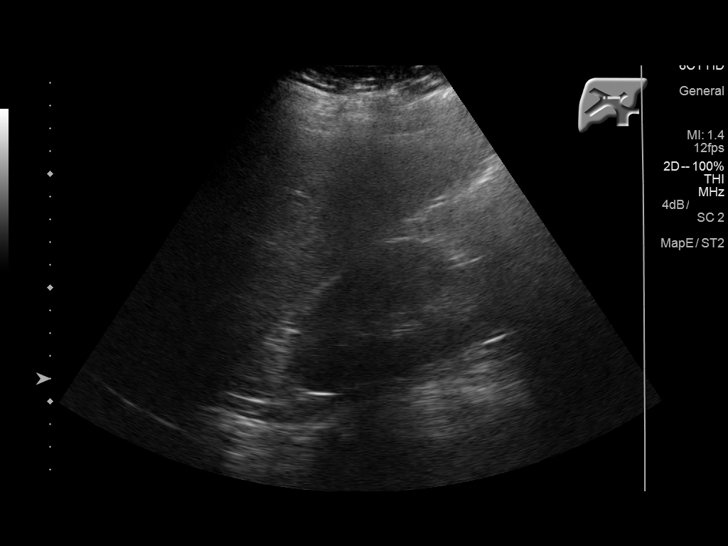
[im 34/75]
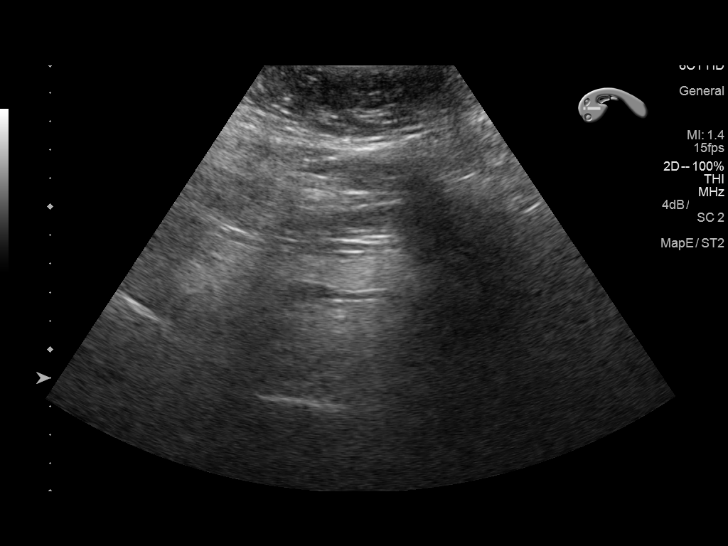
[im 41/75]
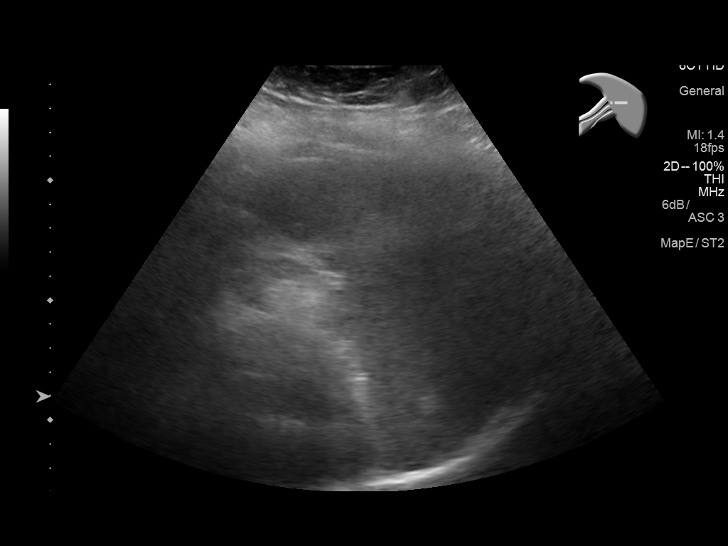
[im 47/75]
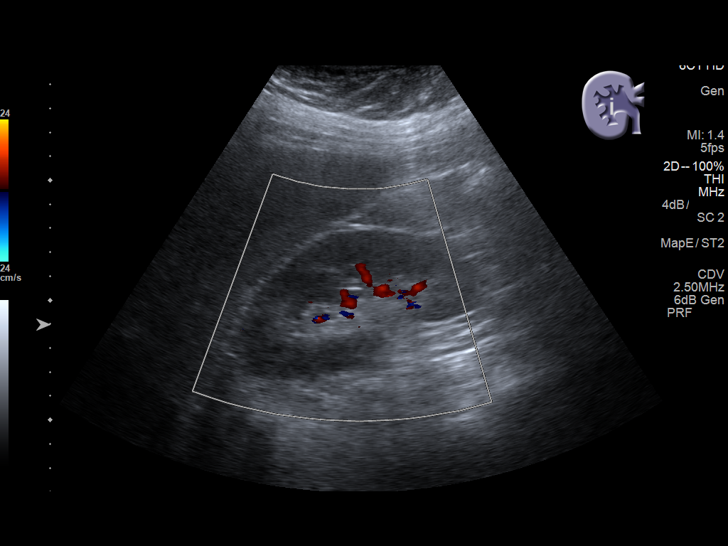
[im 50/75]
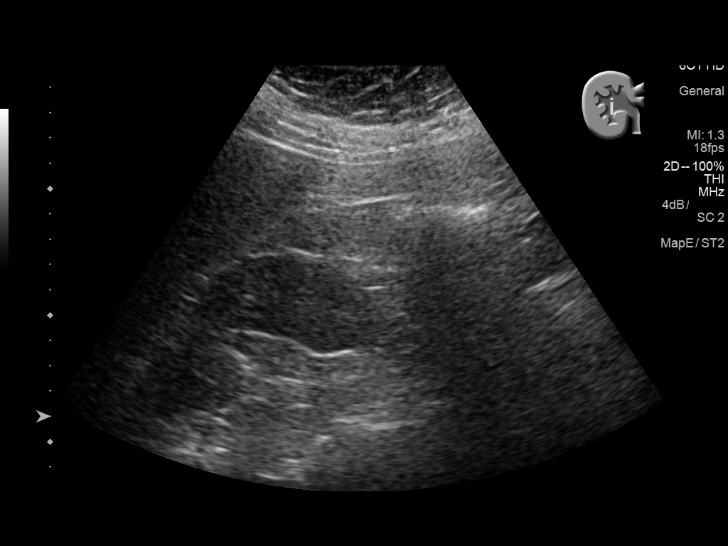
[im 56/75]
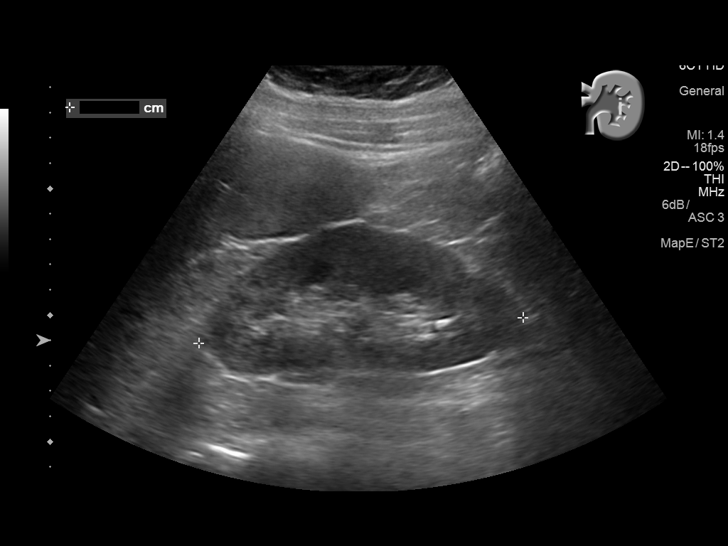
[im 62/75]
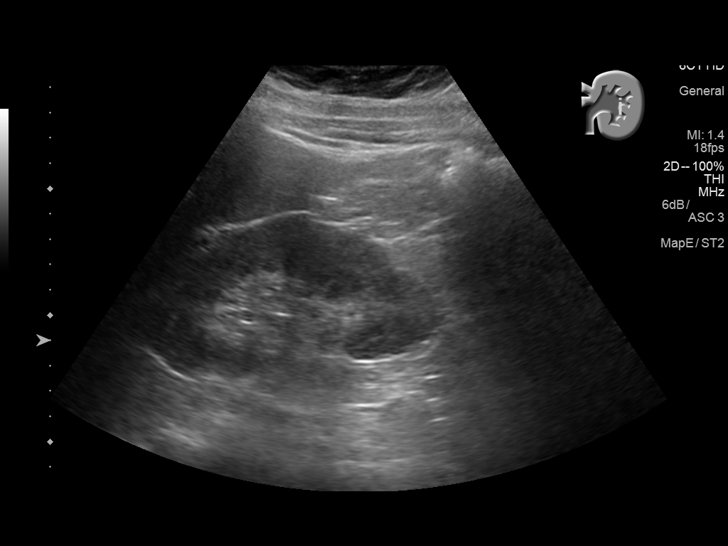
[im 68/75]
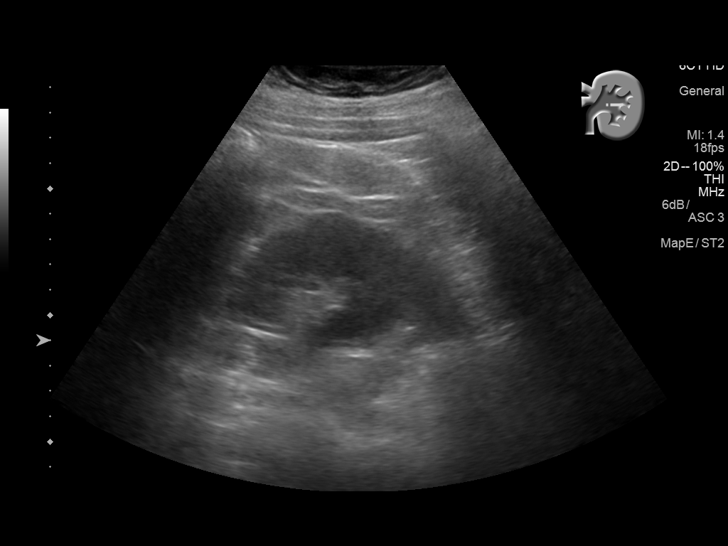
[im 75/75]
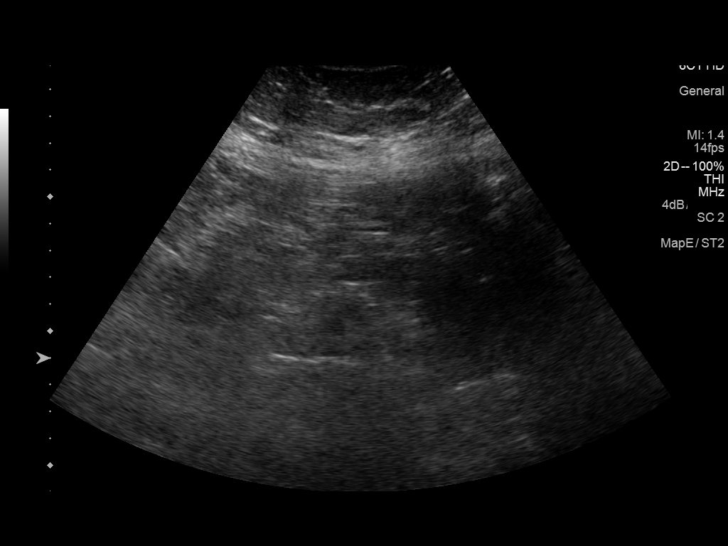

[14 of 25 positions shown; findings below may reference images not displayed]

FINDINGS: Gallbladder: Surgically absent

Common bile duct: Diameter: 5-6 mm, normal following
cholecystectomy.

Liver: Increased echogenicity without evidence of focal lesion.
Portal vein is patent on color Doppler imaging with normal direction
of blood flow towards the liver.

IVC: No abnormality visualized.

Pancreas: Poorly seen because of overlying bowel gas.

Spleen: 13 cm in length.  Volume calculated at 534.7 mL.

Right Kidney: Length: 11.8 cm. Echogenicity within normal limits. No
mass or hydronephrosis visualized.

Left Kidney: Length: 12.8 cm. 4 mm stone without obstruction. No
mass lesion.

Abdominal aorta: No aneurysm visualized.

Other findings: No ascites
IMPRESSION: Echogenic liver consistent with the clinical diagnosis of cirrhosis.
No evidence of focal mass or ductal dilatation. Previous
cholecystectomy. Normal direction portal vein flow. Splenomegaly
suggesting a degree of portal venous hypertension.

4 mm nonobstructing left renal calculus.

## 2019-03-09 ENCOUNTER — Encounter (HOSPITAL_COMMUNITY): Payer: Self-pay
